# Patient Record
Sex: Male | Born: 1955 | Race: White | Hispanic: No | Marital: Married | Smoking: Never smoker
Health system: Southern US, Community
[De-identification: ages and names within clinical notes are randomized; demographics above are authoritative.]

## PROBLEM LIST (undated history)

## (undated) DIAGNOSIS — G9600 Cerebrospinal fluid leak, unspecified: Secondary | ICD-10-CM

## (undated) DIAGNOSIS — C8590 Non-Hodgkin lymphoma, unspecified, unspecified site: Secondary | ICD-10-CM

## (undated) DIAGNOSIS — R519 Headache, unspecified: Secondary | ICD-10-CM

## (undated) DIAGNOSIS — F329 Major depressive disorder, single episode, unspecified: Secondary | ICD-10-CM

## (undated) DIAGNOSIS — I2510 Atherosclerotic heart disease of native coronary artery without angina pectoris: Secondary | ICD-10-CM

## (undated) DIAGNOSIS — C801 Malignant (primary) neoplasm, unspecified: Secondary | ICD-10-CM

## (undated) DIAGNOSIS — Z9289 Personal history of other medical treatment: Secondary | ICD-10-CM

## (undated) DIAGNOSIS — I700 Atherosclerosis of aorta: Secondary | ICD-10-CM

## (undated) DIAGNOSIS — F419 Anxiety disorder, unspecified: Secondary | ICD-10-CM

## (undated) DIAGNOSIS — F32A Depression, unspecified: Secondary | ICD-10-CM

## (undated) DIAGNOSIS — R030 Elevated blood-pressure reading, without diagnosis of hypertension: Secondary | ICD-10-CM

## (undated) HISTORY — DX: Non-Hodgkin lymphoma, unspecified, unspecified site: C85.90

## (undated) HISTORY — PX: BACK SURGERY: SHX140

## (undated) HISTORY — DX: Elevated blood-pressure reading, without diagnosis of hypertension: R03.0

## (undated) HISTORY — DX: Atherosclerosis of aorta: I70.0

---
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
## 2020-04-23 ENCOUNTER — Emergency Department (HOSPITAL_BASED_OUTPATIENT_CLINIC_OR_DEPARTMENT_OTHER): Payer: Managed Care, Other (non HMO)

## 2020-04-23 ENCOUNTER — Encounter (HOSPITAL_BASED_OUTPATIENT_CLINIC_OR_DEPARTMENT_OTHER): Payer: Self-pay | Admitting: Emergency Medicine

## 2020-04-23 ENCOUNTER — Other Ambulatory Visit: Payer: Self-pay

## 2020-04-23 ENCOUNTER — Inpatient Hospital Stay (HOSPITAL_BASED_OUTPATIENT_CLINIC_OR_DEPARTMENT_OTHER)
Admission: EM | Admit: 2020-04-23 | Discharge: 2020-04-26 | DRG: 247 | Disposition: A | Discharge status: Home / Self Care | Payer: Managed Care, Other (non HMO) | Attending: Cardiology | Admitting: Cardiology

## 2020-04-23 DIAGNOSIS — Z8249 Family history of ischemic heart disease and other diseases of the circulatory system: Secondary | ICD-10-CM

## 2020-04-23 DIAGNOSIS — R519 Headache, unspecified: Secondary | ICD-10-CM | POA: Diagnosis present

## 2020-04-23 DIAGNOSIS — Z20822 Contact with and (suspected) exposure to covid-19: Secondary | ICD-10-CM | POA: Diagnosis present

## 2020-04-23 DIAGNOSIS — I25110 Atherosclerotic heart disease of native coronary artery with unstable angina pectoris: Secondary | ICD-10-CM | POA: Diagnosis not present

## 2020-04-23 DIAGNOSIS — Z881 Allergy status to other antibiotic agents status: Secondary | ICD-10-CM

## 2020-04-23 DIAGNOSIS — I25118 Atherosclerotic heart disease of native coronary artery with other forms of angina pectoris: Secondary | ICD-10-CM

## 2020-04-23 DIAGNOSIS — G9600 Cerebrospinal fluid leak, unspecified: Secondary | ICD-10-CM | POA: Diagnosis present

## 2020-04-23 DIAGNOSIS — G8929 Other chronic pain: Secondary | ICD-10-CM

## 2020-04-23 DIAGNOSIS — Z8051 Family history of malignant neoplasm of kidney: Secondary | ICD-10-CM

## 2020-04-23 DIAGNOSIS — K219 Gastro-esophageal reflux disease without esophagitis: Secondary | ICD-10-CM

## 2020-04-23 DIAGNOSIS — Z833 Family history of diabetes mellitus: Secondary | ICD-10-CM

## 2020-04-23 DIAGNOSIS — Z888 Allergy status to other drugs, medicaments and biological substances status: Secondary | ICD-10-CM

## 2020-04-23 DIAGNOSIS — E785 Hyperlipidemia, unspecified: Secondary | ICD-10-CM

## 2020-04-23 DIAGNOSIS — I10 Essential (primary) hypertension: Secondary | ICD-10-CM

## 2020-04-23 DIAGNOSIS — R079 Chest pain, unspecified: Secondary | ICD-10-CM

## 2020-04-23 DIAGNOSIS — Z955 Presence of coronary angioplasty implant and graft: Secondary | ICD-10-CM

## 2020-04-23 DIAGNOSIS — R5383 Other fatigue: Secondary | ICD-10-CM

## 2020-04-23 DIAGNOSIS — R0789 Other chest pain: Secondary | ICD-10-CM | POA: Diagnosis present

## 2020-04-23 DIAGNOSIS — C8590 Non-Hodgkin lymphoma, unspecified, unspecified site: Secondary | ICD-10-CM | POA: Diagnosis present

## 2020-04-23 DIAGNOSIS — Z803 Family history of malignant neoplasm of breast: Secondary | ICD-10-CM

## 2020-04-23 HISTORY — DX: Depression, unspecified: F32.A

## 2020-04-23 HISTORY — DX: Cerebrospinal fluid leak, unspecified: G96.00

## 2020-04-23 HISTORY — DX: Headache, unspecified: R51.9

## 2020-04-23 HISTORY — DX: Anxiety disorder, unspecified: F41.9

## 2020-04-23 HISTORY — DX: Personal history of other medical treatment: Z92.89

## 2020-04-23 HISTORY — DX: Malignant (primary) neoplasm, unspecified: C80.1

## 2020-04-23 HISTORY — DX: Atherosclerotic heart disease of native coronary artery without angina pectoris: I25.10

## 2020-04-23 LAB — CBC
HCT: 44.9 % (ref 39.0–52.0)
Hemoglobin: 15 g/dL (ref 13.0–17.0)
MCH: 31.2 pg (ref 26.0–34.0)
MCHC: 33.4 g/dL (ref 30.0–36.0)
MCV: 93.3 fL (ref 80.0–100.0)
Platelets: 165 10*3/uL (ref 150–400)
RBC: 4.81 MIL/uL (ref 4.22–5.81)
RDW: 12.3 % (ref 11.5–15.5)
WBC: 5.8 10*3/uL (ref 4.0–10.5)
nRBC: 0 % (ref 0.0–0.2)

## 2020-04-23 LAB — CREATININE, SERUM
Creatinine, Ser: 0.98 mg/dL (ref 0.61–1.24)
GFR calc Af Amer: >60 mL/min (ref >60)
GFR calc non Af Amer: >60 mL/min (ref >60)

## 2020-04-23 LAB — BASIC METABOLIC PANEL
Anion gap: 9 (ref 5–15)
BUN: 22 mg/dL (ref 8–23)
CO2: 26 mmol/L (ref 22–32)
Calcium: 9.2 mg/dL (ref 8.9–10.3)
Chloride: 105 mmol/L (ref 98–111)
Creatinine, Ser: 0.91 mg/dL (ref 0.61–1.24)
GFR calc Af Amer: >60 mL/min (ref >60)
GFR calc non Af Amer: >60 mL/min (ref >60)
Glucose, Bld: 99 mg/dL (ref 70–99)
Potassium: 3.9 mmol/L (ref 3.5–5.1)
Sodium: 140 mmol/L (ref 135–145)

## 2020-04-23 LAB — CBC WITH DIFFERENTIAL/PLATELET
Abs Immature Granulocytes: 0.02 10*3/uL (ref 0.00–0.07)
Basophils Absolute: 0 10*3/uL (ref 0.0–0.1)
Basophils Relative: 1 %
Eosinophils Absolute: 0 10*3/uL (ref 0.0–0.5)
Eosinophils Relative: 1 %
HCT: 44.2 % (ref 39.0–52.0)
Hemoglobin: 14.9 g/dL (ref 13.0–17.0)
Immature Granulocytes: 0 %
Lymphocytes Relative: 20 %
Lymphs Abs: 1.3 10*3/uL (ref 0.7–4.0)
MCH: 31.1 pg (ref 26.0–34.0)
MCHC: 33.7 g/dL (ref 30.0–36.0)
MCV: 92.3 fL (ref 80.0–100.0)
Monocytes Absolute: 0.5 10*3/uL (ref 0.1–1.0)
Monocytes Relative: 7 %
Neutro Abs: 4.4 10*3/uL (ref 1.7–7.7)
Neutrophils Relative %: 71 %
Platelets: 157 10*3/uL (ref 150–400)
RBC: 4.79 MIL/uL (ref 4.22–5.81)
RDW: 12.2 % (ref 11.5–15.5)
WBC: 6.2 10*3/uL (ref 4.0–10.5)
nRBC: 0 % (ref 0.0–0.2)

## 2020-04-23 LAB — HIV ANTIBODY (ROUTINE TESTING W REFLEX): HIV Screen 4th Generation wRfx: NONREACTIVE

## 2020-04-23 LAB — TROPONIN I (HIGH SENSITIVITY)
Troponin I (High Sensitivity): 2 ng/L (ref <18)
Troponin I (High Sensitivity): 3 ng/L (ref <18)
Troponin I (High Sensitivity): 4 ng/L (ref <18)

## 2020-04-23 LAB — SARS CORONAVIRUS 2 BY RT PCR (HOSPITAL ORDER, PERFORMED IN ~~LOC~~ HOSPITAL LAB): SARS Coronavirus 2: NEGATIVE

## 2020-04-23 MED ORDER — NITROGLYCERIN 0.4 MG SL SUBL
0.4000 mg | SUBLINGUAL_TABLET | SUBLINGUAL | Status: DC | PRN
  Administered 2020-04-24: 0.8 mg via SUBLINGUAL

## 2020-04-23 MED ORDER — ONDANSETRON HCL 4 MG/2ML IJ SOLN: 4.0000 mg | Freq: Four times a day (QID) | INTRAMUSCULAR | Status: DC | PRN

## 2020-04-23 MED ORDER — GABAPENTIN 300 MG PO CAPS
300.0000 mg | ORAL_CAPSULE | Freq: Three times a day (TID) | ORAL | Status: DC
  Administered 2020-04-23: 300 mg via ORAL
  Filled 2020-04-23: qty 1

## 2020-04-23 MED ORDER — NITROGLYCERIN 0.4 MG SL SUBL
0.4000 mg | SUBLINGUAL_TABLET | Freq: Once | SUBLINGUAL | Status: AC
  Administered 2020-04-23: 0.4 mg via SUBLINGUAL
  Filled 2020-04-23: qty 1

## 2020-04-23 MED ORDER — HEPARIN SODIUM (PORCINE) 5000 UNIT/ML IJ SOLN
5000.0000 [IU] | Freq: Three times a day (TID) | INTRAMUSCULAR | Status: DC
  Administered 2020-04-23 – 2020-04-24 (×4): 5000 [IU] via SUBCUTANEOUS
  Filled 2020-04-23 (×3): qty 1

## 2020-04-23 MED ORDER — ALUM & MAG HYDROXIDE-SIMETH 200-200-20 MG/5ML PO SUSP
30.0000 mL | Freq: Once | ORAL | Status: AC
  Administered 2020-04-23: 30 mL via ORAL
  Filled 2020-04-23: qty 30

## 2020-04-23 MED ORDER — METOPROLOL TARTRATE 50 MG PO TABS
50.0000 mg | ORAL_TABLET | Freq: Once | ORAL | Status: AC
  Administered 2020-04-24: 50 mg via ORAL
  Filled 2020-04-23: qty 1

## 2020-04-23 MED ORDER — ASPIRIN EC 81 MG PO TBEC
81.0000 mg | DELAYED_RELEASE_TABLET | Freq: Every day | ORAL | Status: DC
  Administered 2020-04-24: 81 mg via ORAL
  Filled 2020-04-23: qty 1

## 2020-04-23 MED ORDER — FAMOTIDINE 20 MG PO TABS
20.0000 mg | ORAL_TABLET | Freq: Once | ORAL | Status: AC
  Administered 2020-04-23: 20 mg via ORAL
  Filled 2020-04-23: qty 1

## 2020-04-23 MED ORDER — ACETAMINOPHEN 325 MG PO TABS: 650.0000 mg | ORAL_TABLET | ORAL | Status: DC | PRN

## 2020-04-23 NOTE — ED Triage Notes (Signed)
Pt states he woke up not feeling well. States he felt fatigued and anxious so he checked his BP and found it to be high. Also reports tightness in his chest and L arm discomfort.

## 2020-04-23 NOTE — H&P (Signed)
Cardiology Admission History and Physical:   Patient ID: [facility] MRN: [id number]; DOB: 1956-04-03   Admission date: 04/23/2020  Primary Care Provider: Osie Cheeks, [facility] Cardiologist:  New to [facility] Electrophysiologist:  None   Chief Complaint:  Chest pain  Patient Profile:   Ronald [facility] is a 64 y.o. male with past medical history of GERD, marginal zone lymphoma, chronic headache, and history of CSF leak followed by [facility] presented with chest pain for 2 days, he was transferred from [facility]  History of Present Illness:   [name] is a 64 year old male with past medical history of GERD, marginal zone lymphoma, chronic headache, and history of CSF leak followed by [facility].  He was diagnosed with marginal zone lymphoma in 2020 after noted noticing a right cervical lymph node.  Lymph node was biopsied and diagnosed by [facility] with marginal zone lymphoma.  He denies any prior cardiac history although he does have significant family history of CAD on his mother side.  5 years ago, he underwent treadmill nuclear stress test in [place] which reportedly was negative.  According to his family 64 note, his blood pressure is typically elevated in the medical office however in the 120s range at home.  Therefore he was never started on blood pressure medication.  Patient recently received his second [place] COVID-19 vaccine roughly 2 weeks ago.  Patient presented to [facility] on 04/23/2020 with substernal chest pain and left shoulder pain that started 2 nights ago during sexual intercourse.  Talking to the patient, since the onset of the chest pain, it has never completely went away.  Troponin obtained today at [facility] was negative.  CBC and basic metabolic panel normal.  Chest x-ray negative.  EKG showed normal sinus rhythm without significant ST-T wave changes.  He denies any exacerbating factors such as deep  inspiration, body rotation or palpation.  He denies any other associated symptoms such as shortness of breath, dizziness or sweating.  Patient eventually was transferred from [facility] to [facility] for further evaluation.  Although his blood pressure while at [facility] was elevated in the 160s range, by the time he reached [facility], systolic blood pressure was in the 130s.    Past Medical History:  Diagnosis Date  . Anxiety   . Cancer ([facility])   . CSF leak   . Depression   . Headache     Past Surgical History:  Procedure Laterality Date  . BACK SURGERY       Medications Prior to Admission: Prior to Admission medications   Medication Sig Start Date End Date Taking? Authorizing Provider  gabapentin (NEURONTIN) 300 MG capsule Take 300 mg by mouth 3 (three) times daily.   Yes [provider]     Allergies:    Allergies  Allergen Reactions  . Amitriptyline     Made me feel strange  . Keflex [Cephalexin]     Stomach pain  . Topiramate     Made me feel strange    Social History:   Social History   Socioeconomic History  . Marital status: Married    Spouse name: Not on file  . Number of children: Not on file  . Years of education: Not on file  . Highest education level: Not on file  Occupational History  . Not on file  Tobacco Use  . Smoking status: Never Smoker  . Smokeless tobacco: Never Used  Vaping Use  . Vaping Use: Never used  Substance and Sexual Activity  . Alcohol use: Not Currently  . Drug use: Never  . Sexual activity: Yes  Other Topics Concern  . Not on file  Social History Narrative  . Not on file   Social Determinants of Health   Financial Resource Strain:   . Difficulty of Paying Living Expenses:   Food Insecurity:   . Worried About Charity fundraiser in the Last Year:   . Arboriculturist in the Last Year:   Transportation Needs:   . Film/video editor (Medical):   Marland Kitchen Lack of Transportation  (Non-Medical):   Physical Activity:   . Days of Exercise per Week:   . Minutes of Exercise per Session:   Stress:   . Feeling of Stress :   Social Connections:   . Frequency of Communication with Friends and Family:   . Frequency of Social Gatherings with Friends and Family:   . Attends Religious Services:   . Active Member of Clubs or Organizations:   . Attends Archivist Meetings:   Marland Kitchen Marital Status:   Intimate Partner Violence:   . Fear of Current or Ex-Partner:   . Emotionally Abused:   Marland Kitchen Physically Abused:   . Sexually Abused:     Family History:   The patient's family history includes Breast cancer in his sister; Diabetes Mellitus II in his father; Heart attack in his father and maternal grandfather; Hypertension in his father; Kidney cancer in his brother; Parkinson's disease in an other family member.    ROS:  Please see the history of present illness.  All other ROS reviewed and negative.     Physical Exam/Data:   Vitals:   04/23/20 1001 04/23/20 1224 04/23/20 1252 04/23/20 1348  BP: (!) 159/87  (!) 159/82 (!) 132/97  Pulse: (!) 59 77  63  Resp: 12 18  14   Temp:    98.6 F (37 C)  TempSrc:    Oral  SpO2: 96% 95% 95% 97%  Weight:    88.7 kg  Height:    5\' 9"  (1.753 m)   No intake or output data in the 24 hours ending 04/23/20 1454 Last 3 Weights 04/23/2020 04/23/2020  Weight (lbs) 195 lb 8 oz 190 lb  Weight (kg) 88.678 kg 86.183 kg     Body mass index is 28.87 kg/m.  General:  Well nourished, well developed, in no acute distress HEENT: normal Lymph: no adenopathy Neck: no JVD Endocrine:  No thryomegaly Vascular: No carotid bruits; FA pulses 2+ bilaterally without bruits  Cardiac:  normal S1, S2; RRR; no murmur  Lungs:  clear to auscultation bilaterally, no wheezing, rhonchi or rales  Abd: soft, nontender, no hepatomegaly  Ext: no edema Musculoskeletal:  No deformities, BUE and BLE strength normal and equal Skin: warm and dry  Neuro:  CNs 2-12  intact, no focal abnormalities noted Psych:  Normal affect    EKG:  The ECG that was done and was personally reviewed and demonstrates NSR without significant ST-T wave changes  Relevant CV Studies:  N/A  Laboratory Data:  High Sensitivity Troponin:   Recent Labs  Lab 04/23/20 0810  TROPONINIHS 2      Chemistry Recent Labs  Lab 04/23/20 0810  NA 140  K 3.9  CL 105  CO2 26  GLUCOSE 99  BUN 22  CREATININE 0.91  CALCIUM 9.2  GFRNONAA >60  GFRAA >60  ANIONGAP 9  No results for input(s): PROT, ALBUMIN, AST, ALT, ALKPHOS, BILITOT in the last 168 hours. Hematology Recent Labs  Lab 04/23/20 0810  WBC 6.2  RBC 4.79  HGB 14.9  HCT 44.2  MCV 92.3  MCH 31.1  MCHC 33.7  RDW 12.2  PLT 157   BNPNo results for input(s): BNP, PROBNP in the last 168 hours.  DDimer No results for input(s): DDIMER in the last 168 hours.   Radiology/Studies:  DG Chest 2 View  Result Date: 04/23/2020 CLINICAL DATA:  Chest pain.  Elevated blood pressure. EXAM: CHEST - 2 VIEW COMPARISON:  03/01/2016 FINDINGS: Mild right hemidiaphragm elevation. Numerous leads and wires project over the chest. Midline trachea. Normal heart size and mediastinal contours. No pleural effusion or pneumothorax. Clear lungs. IMPRESSION: No acute cardiopulmonary disease. Electronically Signed   By: Abigail Miyamoto M.D.   On: 04/23/2020 09:07       HEAR Score (for undifferentiated chest pain):  HEAR Score: 3       Assessment and Plan:   1. Chest pain: Per patient, he had a negative treadmill nuclear stress test 5 years ago while traveling to another state.   - Although patient says his chest pain is related to exertion occurred during sexual intercourse, he also mentions chest pain started 2 days ago and has never completely went away.  EKG is normal, troponin negative.    - Will discuss with MD to consider coronary CT  2. Marginal zone lymphoma: Stable on recent CT.  Followed by [facility] hematology  service.  3. History of CSF leak: Followed by [facility]  4. Chronic headache: controlled   Severity of Illness: The appropriate patient status for this patient is OBSERVATION. Observation status is judged to be reasonable and necessary in order to provide the required intensity of service to ensure the patient's safety. The patient's presenting symptoms, physical exam findings, and initial radiographic and laboratory data in the context of their medical condition is felt to place them at decreased risk for further clinical deterioration. Furthermore, it is anticipated that the patient will be medically stable for discharge from the [facility] within 2 midnights of admission. The following factors support the patient status of observation.   " The patient's presenting symptoms include chest pain. " The physical exam findings include benign. " The initial radiographic and laboratory data are normal.     For questions or updates, please contact [place] Please consult [email or web address] for contact info under     Hilbert Corrigan, [place]  04/23/2020 2:54 PM

## 2020-04-23 NOTE — ED Notes (Signed)
Called [facility] line [phone] spoke to Dola Argyle will have Cardiology call Dr.Trifan

## 2020-04-23 NOTE — ED Notes (Signed)
Patient transported to X-ray 

## 2020-04-23 NOTE — ED Provider Notes (Signed)
[facility] Note   CSN: [id number] Arrival date & time: 04/23/20  0741     History Chief Complaint  Patient presents with  . Fatigue    [facility] is a 64 y.o. male w/ hx of reflux, marginal zone lymphoma (not on chemo or radiation, follows at [facility]), chronic headaches, presenting to the ED with chest discomfort and left shoulder pain.  The patient reports onset of symptoms last night during sexual intercourse.  He describes pressure in his left chest and substernum, that feels similar to his "GERD," but is now associated with left shoulder pain, which is new.  The symptoms have been persistent since last night into this morning.  He also describes feeling very "fatigued" since yesterday.  He has chronic headaches and has one now.  He denies nausea, vomiting, or diaphoresis.  He reports this morning his blood pressure at home was also quite elevated.  Normally he is around 924 mmhg systolic, today was 268/341 mmhg.  He does not take anti-hypertensive medications  He states he had similar symptoms 5 years ago and was admitted to the hospital and had a normal stress test, and was discharged home with a presumptive diagnosis of GERD.  Patient received both Moderna covid vaccines most recently 2 weeks ago (2nd dose).  He does not smoke. He normally does not have hypertension.  Per care everywhere records, he had LDL level 140's during wellness check in June 2021.  Mother's family has CV disease, father had MI and diabetes.  He was offered a statin but declined at that time, in favor of lifestyle changes.  He does not have a cardiologist.  HPI  HPI: A 64 year old patient presents for evaluation of chest pain. Initial onset of pain was more than 6 hours ago. The patient's chest pain is described as heaviness/pressure/tightness and is not worse with exertion. The patient's chest pain is middle- or left-sided, is not well-localized, is not sharp and  does not radiate to the arms/jaw/neck. The patient does not complain of nausea and denies diaphoresis. The patient has a family history of coronary artery disease in a first-degree relative with onset less than age 35. The patient has no history of stroke, has no history of peripheral artery disease, has not smoked in the past 90 days, denies any history of treated diabetes, is not hypertensive, has no history of hypercholesterolemia and does not have an elevated BMI (>=30).   Past Medical History:  Diagnosis Date  . Anxiety   . Cancer ([facility])   . CSF leak   . Depression   . Headache     Patient Active Problem List   Diagnosis Date Noted  . Chest pain 04/23/2020    Past Surgical History:  Procedure Laterality Date  . BACK SURGERY         Family History  Problem Relation Age of Onset  . Heart attack Father        died in his sleep, unclear if heart attack or stroke  . Hypertension Father   . Diabetes Mellitus II Father   . Breast cancer Sister   . Kidney cancer Brother   . Parkinson's disease Other   . Heart attack Maternal Grandfather        occurs in his 73s    Social History   Tobacco Use  . Smoking status: Never Smoker  . Smokeless tobacco: Never Used  Vaping Use  . Vaping Use: Never used  Substance Use Topics  .  Alcohol use: Not Currently  . Drug use: Never    Home Medications Prior to Admission medications   Medication Sig Start Date End Date Taking? Authorizing [facility]  ascorbic acid (VITAMIN C) 500 MG tablet Take 500 mg by mouth daily.   Yes [facility], Historical, MD  Cholecalciferol (VITAMIN D3 PO) Take 1 tablet by mouth daily. 75000IU   Yes [facility], Historical, MD  Digestive Enzymes (DIGESTIVE ENZYME PO) Take 1 tablet by mouth in the morning and at bedtime.   Yes Tapia, Historical, MD  gabapentin (NEURONTIN) 300 MG capsule Take 300 mg by mouth 3 (three) times daily.   Yes Tapia, Historical, MD  MAGNESIUM GLYCINATE PLUS PO Take 1 tablet by mouth  daily. 400mg    Yes [facility], Historical, MD  Menaquinone-7 (VITAMIN K2 PO) Take 1 tablet by mouth daily. 150mg    Yes [facility], Historical, MD  OVER THE COUNTER MEDICATION Take 1 tablet by mouth in the morning, at noon, and at bedtime. Medication:Beet Root   Yes [facility], Historical, MD    Allergies    Amitriptyline, Keflex [cephalexin], and Topiramate  Review of Systems   Review of Systems  Constitutional: Positive for fatigue. Negative for chills and fever.  HENT: Negative for ear pain and sore throat.   Eyes: Negative for pain and visual disturbance.  Respiratory: Negative for cough and shortness of breath.   Cardiovascular: Positive for chest pain. Negative for palpitations.  Gastrointestinal: Negative for abdominal pain, nausea and vomiting.  Genitourinary: Negative for dysuria and hematuria.  Musculoskeletal: Positive for arthralgias and myalgias.  Skin: Negative for color change and rash.  Neurological: Positive for headaches. Negative for syncope.  All other systems reviewed and are negative.   Physical Exam Updated Vital Signs BP (!) 132/97 (BP Location: Right Arm)   Pulse 63   Temp 98.6 F (37 C) (Oral)   Resp 14   Ht 5\' 9"  (1.753 m)   Wt 88.7 kg   SpO2 97%   BMI 28.87 kg/m   Physical Exam Vitals and nursing note reviewed.  Constitutional:      Appearance: He is well-developed.  HENT:     Head: Normocephalic and atraumatic.  Eyes:     Conjunctiva/sclera: Conjunctivae normal.  Cardiovascular:     Rate and Rhythm: Normal rate and regular rhythm.     Pulses: Normal pulses.     Heart sounds: No murmur heard.   Pulmonary:     Effort: Pulmonary effort is normal. No respiratory distress.     Breath sounds: Normal breath sounds.  Abdominal:     General: There is no distension.     Palpations: Abdomen is soft.     Tenderness: There is no abdominal tenderness.  Musculoskeletal:        General: No swelling or tenderness.     Cervical back: Neck supple.   Skin:    General: Skin is warm and dry.  Neurological:     General: No focal deficit present.     Mental Status: He is alert and oriented to person, place, and time.  Psychiatric:        Mood and Affect: Mood normal.        Behavior: Behavior normal.     ED Results / Procedures / Treatments   Labs (all labs ordered are listed, but only abnormal results are displayed) Labs Reviewed  SARS CORONAVIRUS 2 BY RT PCR ([facility])  BASIC METABOLIC PANEL  CBC WITH DIFFERENTIAL/PLATELET  HIV ANTIBODY (ROUTINE  TESTING W REFLEX)  CBC  CREATININE, SERUM  TROPONIN I (HIGH SENSITIVITY)  TROPONIN I (HIGH SENSITIVITY)    EKG EKG Interpretation  Date/Time:  Saturday April 23 2020 07:53:42 EDT Ventricular Rate:  71 PR Interval:    QRS Duration: 89 QT Interval:  373 QTC Calculation: 406 R Axis:   63 Text Interpretation: Sinus rhythm No STEMI Confirmed by Octaviano Glow [phone]) on 04/23/2020 8:16:34 AM   Radiology DG Chest 2 View  Result Date: 04/23/2020 CLINICAL DATA:  Chest pain.  Elevated blood pressure. EXAM: CHEST - 2 VIEW COMPARISON:  03/01/2016 FINDINGS: Mild right hemidiaphragm elevation. Numerous leads and wires project over the chest. Midline trachea. Normal heart size and mediastinal contours. No pleural effusion or pneumothorax. Clear lungs. IMPRESSION: No acute cardiopulmonary disease. Electronically Signed   By: Abigail Miyamoto M.D.   On: 04/23/2020 09:07    Procedures Procedures (including critical care time)  Medications Ordered in ED Medications  aspirin EC tablet 81 mg (has no administration in time range)  nitroGLYCERIN (NITROSTAT) SL tablet 0.4 mg (has no administration in time range)  acetaminophen (TYLENOL) tablet 650 mg (has no administration in time range)  ondansetron (ZOFRAN) injection 4 mg (has no administration in time range)  heparin injection 5,000 Units (has no administration in time range)  metoprolol tartrate  (LOPRESSOR) tablet 50 mg (has no administration in time range)  nitroGLYCERIN (NITROSTAT) SL tablet 0.4 mg (0.4 mg Sublingual Given 04/23/20 0811)  alum & mag hydroxide-simeth (MAALOX/MYLANTA) 200-200-20 MG/5ML suspension 30 mL (30 mLs Oral Given 04/23/20 0903)  famotidine (PEPCID) tablet 20 mg (20 mg Oral Given 04/23/20 6314)    ED Course  I have reviewed the triage vital signs and the nursing notes.  Pertinent labs & imaging results that were available during my care of the patient were reviewed by me and considered in my medical decision making (see chart for details).  64 yo male presenting to ED with chest pain and left shoulder pain since last night, which began during intercourse.  DDx includes ACS vs Reflux/GERD vs gastritis vs PNA vs PTX vs Cervical angina vs muscle strain vs other  He seems to think the chest pressure is very similar to his reflux, which he has had often in the past.  He is most concerned about his left shoulder pain and fatigue, which are new symptoms, and I agree are more concerning for coronary etiology.  ECG on arrival per my interpretation shows NSR with no ischemic changes.  HR 71, QTc 406.  Telemetry reviewed with NSR.  Labs ordered including trop, BMP, CBC with diff.  I personally reviewed this labs which showed normal CBC, BMP, Trop (2) and COVID negative.  DG chest obtained and per my interpretation shows no acute pulmonary disease or consolidation  We'll try 1 SL nitro, see if this improves his symptoms.  If not, we'll try a GI cocktail as well.  He did receive both doses of Moderna for Covid confirmed by record review within the past month.  *  I personally reviewed prior medical records including his office visit for wellness check in June 2021  Clinical Course as of Apr 23 1536  Sat Apr 23, 2020  9702 No change in symptoms after SL nitro.  Labs returned with hs trop of 2, with symptom onset > 12 hours ago, I don't think we need to repeat this  level.  We'll try GI cocktail to see if this may be reflux related.   [MT]  [phone]  IMPRESSION: No acute cardiopulmonary disease.    [MT]  1033 I consulted with Dr Radford Pax from cardiology who agreed with an obs admission for CT coronary angiogram.  Patient and wife informed and agreeable to staying.  They were made aware of a critical bed shortage at [facility] with an estimated wait time 10-24 hours, although we will make every effort to expedite the process.  He remains stable on telemetry monitor with no change in symptoms.   [MT]    Clinical Course User Index [MT] Columbia Pandey, Carola Rhine, MD    Final Clinical Impression(s) / ED Diagnoses Final diagnoses:  Chest pain, unspecified type  Fatigue, unspecified type    Rx / DC Orders ED Discharge Orders    None       Wyvonnia Dusky, MD 04/23/20 1537

## 2020-04-24 ENCOUNTER — Other Ambulatory Visit: Payer: Self-pay | Admitting: Cardiology

## 2020-04-24 ENCOUNTER — Observation Stay (HOSPITAL_COMMUNITY): Payer: Managed Care, Other (non HMO)

## 2020-04-24 ENCOUNTER — Observation Stay (HOSPITAL_BASED_OUTPATIENT_CLINIC_OR_DEPARTMENT_OTHER): Payer: Managed Care, Other (non HMO)

## 2020-04-24 ENCOUNTER — Encounter (HOSPITAL_COMMUNITY): Payer: Self-pay | Admitting: Cardiology

## 2020-04-24 DIAGNOSIS — K219 Gastro-esophageal reflux disease without esophagitis: Secondary | ICD-10-CM

## 2020-04-24 DIAGNOSIS — E7800 Pure hypercholesterolemia, unspecified: Secondary | ICD-10-CM

## 2020-04-24 DIAGNOSIS — R079 Chest pain, unspecified: Secondary | ICD-10-CM | POA: Diagnosis not present

## 2020-04-24 DIAGNOSIS — E785 Hyperlipidemia, unspecified: Secondary | ICD-10-CM

## 2020-04-24 DIAGNOSIS — I2510 Atherosclerotic heart disease of native coronary artery without angina pectoris: Secondary | ICD-10-CM | POA: Diagnosis not present

## 2020-04-24 HISTORY — DX: Gastro-esophageal reflux disease without esophagitis: K21.9

## 2020-04-24 HISTORY — DX: Hyperlipidemia, unspecified: E78.5

## 2020-04-24 LAB — BASIC METABOLIC PANEL
Anion gap: 8 (ref 5–15)
BUN: 15 mg/dL (ref 8–23)
CO2: 26 mmol/L (ref 22–32)
Calcium: 9.5 mg/dL (ref 8.9–10.3)
Chloride: 102 mmol/L (ref 98–111)
Creatinine, Ser: 1.01 mg/dL (ref 0.61–1.24)
GFR calc Af Amer: >60 mL/min (ref >60)
GFR calc non Af Amer: >60 mL/min (ref >60)
Glucose, Bld: 101 mg/dL — ABNORMAL HIGH (ref 70–99)
Potassium: 4 mmol/L (ref 3.5–5.1)
Sodium: 136 mmol/L (ref 135–145)

## 2020-04-24 LAB — LIPID PANEL
Cholesterol: 242 mg/dL — ABNORMAL HIGH (ref 0–200)
HDL: 41 mg/dL (ref >40)
LDL Cholesterol: 178 mg/dL — ABNORMAL HIGH (ref 0–99)
Total CHOL/HDL Ratio: 5.9 RATIO
Triglycerides: 116 mg/dL (ref <150)
VLDL: 23 mg/dL (ref 0–40)

## 2020-04-24 LAB — ECHOCARDIOGRAM COMPLETE
Area-P 1/2: 4.49 cm2
Height: 69 in
S' Lateral: 2.7 cm
Weight: 3068.8 oz

## 2020-04-24 MED ORDER — GABAPENTIN 300 MG PO CAPS
300.0000 mg | ORAL_CAPSULE | Freq: Three times a day (TID) | ORAL | Status: DC
  Administered 2020-04-24 – 2020-04-26 (×7): 300 mg via ORAL
  Filled 2020-04-24 (×7): qty 1

## 2020-04-24 MED ORDER — NITROGLYCERIN 0.4 MG SL SUBL
SUBLINGUAL_TABLET | SUBLINGUAL | Status: AC
  Filled 2020-04-24: qty 2

## 2020-04-24 MED ORDER — SODIUM CHLORIDE 0.9% FLUSH
3.0000 mL | Freq: Two times a day (BID) | INTRAVENOUS | Status: DC
  Administered 2020-04-25 – 2020-04-26 (×3): 3 mL via INTRAVENOUS

## 2020-04-24 MED ORDER — IOHEXOL 350 MG/ML SOLN
80.0000 mL | Freq: Once | INTRAVENOUS | Status: AC | PRN
  Administered 2020-04-24: 80 mL via INTRAVENOUS

## 2020-04-24 NOTE — Progress Notes (Signed)
Progress Note  Patient Name: [facility] Date of Encounter: 04/24/2020  [facility] Cardiologist: Fransico Him, MD    Subjective   He is still having some chest pressure this AM.  He took metoprolol without eating this AM and is s/w nauseated.     Inpatient Medications    Scheduled Meds:  aspirin EC  81 mg Oral Daily   gabapentin  300 mg Oral TID   heparin  5,000 Units Subcutaneous Q8H   Continuous Infusions:  PRN Meds: acetaminophen, nitroGLYCERIN, ondansetron (ZOFRAN) IV   Vital Signs    Vitals:   04/23/20 1707 04/23/20 2022 04/24/20 0014 04/24/20 0603  BP: 134/82 113/81 117/82 131/83  Pulse: 60 72 60 66  Resp: 16 15 17 18   Temp: 98 F (36.7 C) 98.5 F (36.9 C) 97.6 F (36.4 C) 97.6 F (36.4 C)  TempSrc: Oral Oral Other (Comment) Oral  SpO2: 99% 96% 98% 97%  Weight:    87 kg  Height:        Intake/Output Summary (Last 24 hours) at 04/24/2020 0926 Last data filed at 04/23/2020 2000 Gross per 24 hour  Intake 360 ml  Output --  Net 360 ml   Last 3 Weights 04/24/2020 04/23/2020 04/23/2020  Weight (lbs) 191 lb 12.8 oz 195 lb 8 oz 190 lb  Weight (kg) 87 kg 88.678 kg 86.183 kg      Telemetry    Normal sinus rhythm  - Personally Reviewed    Physical Exam   GEN: No acute distress.   Neck: supple  Cardiac: RRR, no murmurs, rubs, or gallops.  Respiratory: Clear to auscultation bilaterally. GI: Soft, nontender  MS: No edema; No deformity. Neuro:  Nonfocal  Psych: Normal affect   Labs    High Sensitivity Troponin:   Recent Labs  Lab 04/23/20 0810 04/23/20 1625 04/23/20 1717  TROPONINIHS 2 3 4       Chemistry Recent Labs  Lab 04/23/20 0810 04/23/20 1625 04/24/20 0532  NA 140  --  136  K 3.9  --  4.0  CL 105  --  102  CO2 26  --  26  GLUCOSE 99  --  101*  BUN 22  --  15  CREATININE 0.91 0.98 1.01  CALCIUM 9.2  --  9.5  GFRNONAA >60 >60 >60  GFRAA >60 >60 >60  ANIONGAP 9  --  8     Hematology Recent Labs  Lab  04/23/20 0810 04/23/20 1625  WBC 6.2 5.8  RBC 4.79 4.81  HGB 14.9 15.0  HCT 44.2 44.9  MCV 92.3 93.3  MCH 31.1 31.2  MCHC 33.7 33.4  RDW 12.2 12.3  PLT 157 165   04/24/2020: HDL 41; LDL Cholesterol 178     Radiology    DG Chest 2 View  Result Date: 04/23/2020 CLINICAL DATA:  Chest pain.  Elevated blood pressure. EXAM: CHEST - 2 VIEW COMPARISON:  03/01/2016 FINDINGS: Mild right hemidiaphragm elevation. Numerous leads and wires project over the chest. Midline trachea. Normal heart size and mediastinal contours. No pleural effusion or pneumothorax. Clear lungs. IMPRESSION: No acute cardiopulmonary disease. Electronically Signed   By: Abigail Miyamoto M.D.   On: 04/23/2020 09:07    Cardiac Studies   Echocardiogram pending Coronary CTA pending   Patient Profile     64 y.o. male with lymphoma, chronic HA 2/2 CSF leak, GERD, white coat hypertension, FHx of CAD.  He presented with ongoing chest pain > 48 hours in duration.  High sensitivity Troponin  levels have been neg.  Plan is for an echocardiogram and Coronary CTA to further evaluate.     Assessment & Plan    1. Chest pain  He continue to have chest pain.  Hs-Trop levels are all neg.  Coronary CTA and echocardiogram pending.   2. Hyperlipidemia  10-year ASCVD risk score is 14.9%.  It is reasonable to consider mod to high intensity statin Rx.  Will await CT results before initiating Rx. The 10-year ASCVD risk score Mikey Bussing DC Brooke Bonito., et al., 2013) is: 14.9%   Values used to calculate the score:     Age: 64 years     Sex: Male     Is Non-Hispanic African American: No     Diabetic: No     Tobacco smoker: No     Systolic Blood Pressure: 852 mmHg     Is BP treated: No     HDL Cholesterol: 41 mg/dL     Total Cholesterol: 242 mg/dL   3. 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  I will adjust schedule.   For questions or updates, please contact [place] Please consult  [email or web address] for contact info under        Signed, Richardson Dopp, PA-C  04/24/2020, 9:26 AM

## 2020-04-24 NOTE — Progress Notes (Signed)
  Echocardiogram 2D Echocardiogram has been performed.  [facility] 04/24/2020, 8:47 AM

## 2020-04-24 NOTE — Progress Notes (Signed)
°  Echocardiogram 2D Echocardiogram has been performed.  [facility] 04/24/2020, 8:49 AM

## 2020-04-25 ENCOUNTER — Encounter (HOSPITAL_COMMUNITY)
Admission: EM | Disposition: A | Discharge status: Home / Self Care | Payer: Self-pay | Source: Home / Self Care | Attending: Cardiology

## 2020-04-25 DIAGNOSIS — Z20822 Contact with and (suspected) exposure to covid-19: Secondary | ICD-10-CM | POA: Diagnosis present

## 2020-04-25 DIAGNOSIS — E782 Mixed hyperlipidemia: Secondary | ICD-10-CM

## 2020-04-25 DIAGNOSIS — Z8249 Family history of ischemic heart disease and other diseases of the circulatory system: Secondary | ICD-10-CM | POA: Diagnosis not present

## 2020-04-25 DIAGNOSIS — I200 Unstable angina: Secondary | ICD-10-CM | POA: Diagnosis not present

## 2020-04-25 DIAGNOSIS — R0789 Other chest pain: Secondary | ICD-10-CM | POA: Diagnosis not present

## 2020-04-25 DIAGNOSIS — I2510 Atherosclerotic heart disease of native coronary artery without angina pectoris: Secondary | ICD-10-CM

## 2020-04-25 DIAGNOSIS — I10 Essential (primary) hypertension: Secondary | ICD-10-CM | POA: Diagnosis present

## 2020-04-25 DIAGNOSIS — Z881 Allergy status to other antibiotic agents status: Secondary | ICD-10-CM | POA: Diagnosis not present

## 2020-04-25 DIAGNOSIS — E785 Hyperlipidemia, unspecified: Secondary | ICD-10-CM | POA: Diagnosis present

## 2020-04-25 DIAGNOSIS — K219 Gastro-esophageal reflux disease without esophagitis: Secondary | ICD-10-CM | POA: Diagnosis present

## 2020-04-25 DIAGNOSIS — R519 Headache, unspecified: Secondary | ICD-10-CM | POA: Diagnosis present

## 2020-04-25 DIAGNOSIS — R079 Chest pain, unspecified: Secondary | ICD-10-CM

## 2020-04-25 DIAGNOSIS — I25110 Atherosclerotic heart disease of native coronary artery with unstable angina pectoris: Secondary | ICD-10-CM | POA: Diagnosis present

## 2020-04-25 DIAGNOSIS — C8590 Non-Hodgkin lymphoma, unspecified, unspecified site: Secondary | ICD-10-CM | POA: Diagnosis present

## 2020-04-25 DIAGNOSIS — G9600 Cerebrospinal fluid leak, unspecified: Secondary | ICD-10-CM | POA: Diagnosis present

## 2020-04-25 DIAGNOSIS — Z803 Family history of malignant neoplasm of breast: Secondary | ICD-10-CM | POA: Diagnosis not present

## 2020-04-25 DIAGNOSIS — Z8051 Family history of malignant neoplasm of kidney: Secondary | ICD-10-CM | POA: Diagnosis not present

## 2020-04-25 DIAGNOSIS — I25118 Atherosclerotic heart disease of native coronary artery with other forms of angina pectoris: Secondary | ICD-10-CM | POA: Diagnosis not present

## 2020-04-25 DIAGNOSIS — Z833 Family history of diabetes mellitus: Secondary | ICD-10-CM | POA: Diagnosis not present

## 2020-04-25 DIAGNOSIS — Z888 Allergy status to other drugs, medicaments and biological substances status: Secondary | ICD-10-CM | POA: Diagnosis not present

## 2020-04-25 DIAGNOSIS — G8929 Other chronic pain: Secondary | ICD-10-CM

## 2020-04-25 HISTORY — PX: CORONARY STENT INTERVENTION: CATH118234

## 2020-04-25 HISTORY — PX: LEFT HEART CATH AND CORONARY ANGIOGRAPHY: CATH118249

## 2020-04-25 LAB — MRSA PCR SCREENING: MRSA by PCR: NEGATIVE

## 2020-04-25 LAB — POCT ACTIVATED CLOTTING TIME: Activated Clotting Time: 329 seconds

## 2020-04-25 SURGERY — LEFT HEART CATH AND CORONARY ANGIOGRAPHY
Anesthesia: LOCAL

## 2020-04-25 MED ORDER — SODIUM CHLORIDE 0.9% FLUSH: 3.0000 mL | INTRAVENOUS | Status: DC | PRN

## 2020-04-25 MED ORDER — MORPHINE SULFATE (PF) 2 MG/ML IV SOLN
INTRAVENOUS | Status: AC
  Filled 2020-04-25: qty 1

## 2020-04-25 MED ORDER — HYDRALAZINE HCL 20 MG/ML IJ SOLN
INTRAMUSCULAR | Status: DC | PRN
  Administered 2020-04-25: 10 mg via INTRAVENOUS

## 2020-04-25 MED ORDER — LABETALOL HCL 5 MG/ML IV SOLN: 10.0000 mg | INTRAVENOUS | Status: AC | PRN

## 2020-04-25 MED ORDER — LIDOCAINE HCL (PF) 1 % IJ SOLN
INTRAMUSCULAR | Status: DC | PRN
  Administered 2020-04-25: 1 mL via INTRADERMAL

## 2020-04-25 MED ORDER — CHLORHEXIDINE GLUCONATE CLOTH 2 % EX PADS
6.0000 | MEDICATED_PAD | Freq: Every day | CUTANEOUS | Status: DC
  Administered 2020-04-26: 6 via TOPICAL

## 2020-04-25 MED ORDER — NITROGLYCERIN IN D5W 200-5 MCG/ML-% IV SOLN
INTRAVENOUS | Status: AC
  Filled 2020-04-25: qty 250

## 2020-04-25 MED ORDER — SODIUM CHLORIDE 0.9% FLUSH
3.0000 mL | Freq: Two times a day (BID) | INTRAVENOUS | Status: DC
  Administered 2020-04-26: 3 mL via INTRAVENOUS

## 2020-04-25 MED ORDER — HEPARIN SODIUM (PORCINE) 1000 UNIT/ML IJ SOLN
INTRAMUSCULAR | Status: AC
  Filled 2020-04-25: qty 1

## 2020-04-25 MED ORDER — HEPARIN (PORCINE) 25000 UT/250ML-% IV SOLN
1000.0000 [IU]/h | INTRAVENOUS | Status: DC
  Administered 2020-04-25: 1000 [IU]/h via INTRAVENOUS
  Filled 2020-04-25: qty 250

## 2020-04-25 MED ORDER — ATORVASTATIN CALCIUM 80 MG PO TABS
80.0000 mg | ORAL_TABLET | Freq: Every day | ORAL | Status: DC
  Administered 2020-04-25 – 2020-04-26 (×2): 80 mg via ORAL
  Filled 2020-04-25 (×2): qty 1

## 2020-04-25 MED ORDER — ONDANSETRON HCL 4 MG/2ML IJ SOLN: 4.0000 mg | Freq: Four times a day (QID) | INTRAMUSCULAR | Status: DC | PRN

## 2020-04-25 MED ORDER — NITROGLYCERIN 1 MG/10 ML FOR IR/CATH LAB
INTRA_ARTERIAL | Status: AC
  Filled 2020-04-25: qty 10

## 2020-04-25 MED ORDER — ASPIRIN 81 MG PO CHEW
81.0000 mg | CHEWABLE_TABLET | Freq: Every day | ORAL | Status: DC
  Administered 2020-04-25 – 2020-04-26 (×2): 81 mg via ORAL
  Filled 2020-04-25 (×2): qty 1

## 2020-04-25 MED ORDER — IOHEXOL 350 MG/ML SOLN
INTRAVENOUS | Status: DC | PRN
  Administered 2020-04-25: 225 mL via INTRA_ARTERIAL

## 2020-04-25 MED ORDER — FAMOTIDINE IN NACL 20-0.9 MG/50ML-% IV SOLN
INTRAVENOUS | Status: AC
  Filled 2020-04-25: qty 50

## 2020-04-25 MED ORDER — HEPARIN SODIUM (PORCINE) 1000 UNIT/ML IJ SOLN
INTRAMUSCULAR | Status: DC | PRN
  Administered 2020-04-25: 4500 [IU] via INTRAVENOUS
  Administered 2020-04-25: 6000 [IU] via INTRAVENOUS

## 2020-04-25 MED ORDER — PANTOPRAZOLE SODIUM 40 MG PO TBEC
40.0000 mg | DELAYED_RELEASE_TABLET | Freq: Every day | ORAL | Status: DC
  Administered 2020-04-25 – 2020-04-26 (×2): 40 mg via ORAL
  Filled 2020-04-25 (×2): qty 1

## 2020-04-25 MED ORDER — HEPARIN BOLUS VIA INFUSION
4000.0000 [IU] | Freq: Once | INTRAVENOUS | Status: AC
  Administered 2020-04-25: 4000 [IU] via INTRAVENOUS
  Filled 2020-04-25: qty 4000

## 2020-04-25 MED ORDER — HYDRALAZINE HCL 20 MG/ML IJ SOLN: 10.0000 mg | INTRAMUSCULAR | Status: AC | PRN

## 2020-04-25 MED ORDER — ATORVASTATIN CALCIUM 80 MG PO TABS
80.0000 mg | ORAL_TABLET | Freq: Every day | ORAL | Status: DC
  Administered 2020-04-25: 80 mg via ORAL
  Filled 2020-04-25: qty 1

## 2020-04-25 MED ORDER — LIDOCAINE HCL (PF) 1 % IJ SOLN
INTRAMUSCULAR | Status: AC
  Filled 2020-04-25: qty 30

## 2020-04-25 MED ORDER — CLOPIDOGREL BISULFATE 75 MG PO TABS
75.0000 mg | ORAL_TABLET | Freq: Every day | ORAL | Status: DC
  Administered 2020-04-26: 75 mg via ORAL
  Filled 2020-04-25: qty 1

## 2020-04-25 MED ORDER — CLOPIDOGREL BISULFATE 300 MG PO TABS
ORAL_TABLET | ORAL | Status: DC | PRN
  Administered 2020-04-25: 600 mg via ORAL

## 2020-04-25 MED ORDER — ASPIRIN 81 MG PO CHEW
81.0000 mg | CHEWABLE_TABLET | ORAL | Status: AC
  Administered 2020-04-25: 81 mg via ORAL
  Filled 2020-04-25: qty 1

## 2020-04-25 MED ORDER — ACETAMINOPHEN 325 MG PO TABS: 650.0000 mg | ORAL_TABLET | ORAL | Status: DC | PRN

## 2020-04-25 MED ORDER — HEPARIN (PORCINE) IN NACL 1000-0.9 UT/500ML-% IV SOLN
INTRAVENOUS | Status: DC | PRN
  Administered 2020-04-25 (×2): 500 mL

## 2020-04-25 MED ORDER — SODIUM CHLORIDE 0.9 % IV SOLN: 250.0000 mL | INTRAVENOUS | Status: DC | PRN

## 2020-04-25 MED ORDER — VERAPAMIL HCL 2.5 MG/ML IV SOLN
INTRA_ARTERIAL | Status: DC | PRN
  Administered 2020-04-25: 5 mL via INTRA_ARTERIAL

## 2020-04-25 MED ORDER — FAMOTIDINE IN NACL 20-0.9 MG/50ML-% IV SOLN
INTRAVENOUS | Status: AC | PRN
  Administered 2020-04-25: 20 mg via INTRAVENOUS

## 2020-04-25 MED ORDER — ASPIRIN EC 81 MG PO TBEC: 81.0000 mg | DELAYED_RELEASE_TABLET | Freq: Every day | ORAL | Status: DC

## 2020-04-25 MED ORDER — FENTANYL CITRATE (PF) 100 MCG/2ML IJ SOLN
INTRAMUSCULAR | Status: AC
  Filled 2020-04-25: qty 2

## 2020-04-25 MED ORDER — SODIUM CHLORIDE 0.9 % WEIGHT BASED INFUSION: 3.0000 mL/kg/h | INTRAVENOUS | Status: DC

## 2020-04-25 MED ORDER — SODIUM CHLORIDE 0.9 % WEIGHT BASED INFUSION: 1.0000 mL/kg/h | INTRAVENOUS | Status: DC

## 2020-04-25 MED ORDER — FENTANYL CITRATE (PF) 100 MCG/2ML IJ SOLN
INTRAMUSCULAR | Status: DC | PRN
  Administered 2020-04-25: 50 ug via INTRAVENOUS
  Administered 2020-04-25: 25 ug via INTRAVENOUS

## 2020-04-25 MED ORDER — HEPARIN (PORCINE) IN NACL 1000-0.9 UT/500ML-% IV SOLN
INTRAVENOUS | Status: AC
  Filled 2020-04-25: qty 1000

## 2020-04-25 MED ORDER — SODIUM CHLORIDE 0.9 % IV SOLN: INTRAVENOUS | Status: DC

## 2020-04-25 MED ORDER — ATORVASTATIN CALCIUM 80 MG PO TABS: 80.0000 mg | ORAL_TABLET | Freq: Every day | ORAL | Status: DC

## 2020-04-25 MED ORDER — MORPHINE SULFATE (PF) 2 MG/ML IV SOLN
2.0000 mg | INTRAVENOUS | Status: DC | PRN
  Administered 2020-04-25: 2 mg via INTRAVENOUS

## 2020-04-25 MED ORDER — NITROGLYCERIN IN D5W 200-5 MCG/ML-% IV SOLN
INTRAVENOUS | Status: AC | PRN
  Administered 2020-04-25: 5 ug/min via INTRAVENOUS

## 2020-04-25 MED ORDER — HYDRALAZINE HCL 20 MG/ML IJ SOLN
INTRAMUSCULAR | Status: AC
  Filled 2020-04-25: qty 1

## 2020-04-25 MED ORDER — MIDAZOLAM HCL 2 MG/2ML IJ SOLN
INTRAMUSCULAR | Status: AC
  Filled 2020-04-25: qty 2

## 2020-04-25 MED ORDER — VERAPAMIL HCL 2.5 MG/ML IV SOLN
INTRAVENOUS | Status: AC
  Filled 2020-04-25: qty 2

## 2020-04-25 MED ORDER — MIDAZOLAM HCL 2 MG/2ML IJ SOLN
INTRAMUSCULAR | Status: DC | PRN
  Administered 2020-04-25: 1 mg via INTRAVENOUS

## 2020-04-25 MED ORDER — SODIUM CHLORIDE 0.9 % IV SOLN: INTRAVENOUS | Status: AC

## 2020-04-25 MED ORDER — CLOPIDOGREL BISULFATE 300 MG PO TABS
ORAL_TABLET | ORAL | Status: AC
  Filled 2020-04-25: qty 2

## 2020-04-25 SURGICAL SUPPLY — 17 items
BALLN SAPPHIRE 2.0X15 (BALLOONS) ×2
BALLN SAPPHIRE ~~LOC~~ 3.25X12 (BALLOONS) ×1 IMPLANT
BALLOON SAPPHIRE 2.0X15 (BALLOONS) IMPLANT
CATH OPTITORQUE TIG 4.0 5F (CATHETERS) ×1 IMPLANT
CATH VISTA GUIDE 6FR XBLAD3.5 (CATHETERS) ×1 IMPLANT
DEVICE RAD COMP TR BAND LRG (VASCULAR PRODUCTS) ×1 IMPLANT
GLIDESHEATH SLEND A-KIT 6F 22G (SHEATH) ×1 IMPLANT
GUIDEWIRE INQWIRE 1.5J.035X260 (WIRE) IMPLANT
INQWIRE 1.5J .035X260CM (WIRE) ×2
KIT ENCORE 26 ADVANTAGE (KITS) ×1 IMPLANT
KIT HEART LEFT (KITS) ×2 IMPLANT
PACK CARDIAC CATHETERIZATION (CUSTOM PROCEDURE TRAY) ×2 IMPLANT
STENT RESOLUTE ONYX 2.75X38 (Permanent Stent) ×1 IMPLANT
TRANSDUCER W/STOPCOCK (MISCELLANEOUS) ×2 IMPLANT
TUBING CIL FLEX 10 FLL-RA (TUBING) ×2 IMPLANT
WIRE ASAHI PROWATER 180CM (WIRE) ×3 IMPLANT
WIRE HI TORQ VERSACORE-J 145CM (WIRE) ×1 IMPLANT

## 2020-04-25 NOTE — Progress Notes (Signed)
ANTICOAGULATION CONSULT NOTE - Initial Consult  Pharmacy Consult for heparin Indication: chest pain/ACS  Allergies  Allergen Reactions  . Amitriptyline     Made me feel strange  . Keflex [Cephalexin]     Stomach pain  . Topiramate     Made me feel strange    Patient Measurements: Height: 5\' 9"  (175.3 cm) Weight: 86.2 kg (190 lb) IBW/kg (Calculated) : 70.7 Heparin Dosing Weight: 88.5kg  Vital Signs: Temp: 97.4 F (36.3 C) (07/19 0712) Temp Source: Oral (07/19 0712) BP: 133/80 (07/19 0712) Pulse Rate: 58 (07/19 0712)  Labs: Recent Labs    04/23/20 0810 04/23/20 1625 04/23/20 1717 04/24/20 0532  HGB 14.9 15.0  --   --   HCT 44.2 44.9  --   --   PLT 157 165  --   --   CREATININE 0.91 0.98  --  1.01  TROPONINIHS 2 3 4   --     Estimated Creatinine Clearance: 81.4 mL/min (by C-G formula based on SCr of 1.01 mg/dL).   Medical History: Past Medical History:  Diagnosis Date  . Anxiety   . Cancer ([place])   . 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 Depression   . GERD (gastroesophageal reflux disease) 04/24/2020  . Headache   . Hyperlipidemia 04/24/2020    Medications:  Scheduled:  . [START ON 04/26/2020] aspirin EC  81 mg Oral Daily  . atorvastatin  80 mg Oral Daily  . gabapentin  300 mg Oral TID  . heparin  5,000 Units Subcutaneous Q8H  . pantoprazole  40 mg Oral Q0600  . sodium chloride flush  3 mL Intravenous Q12H   Infusions:  . sodium chloride    . sodium chloride 100 mL/hr at 04/25/20 0830  . [START ON 04/26/2020] sodium chloride     Followed by  . [START ON 04/26/2020] sodium chloride      Assessment: 64 year old male found to have possible LAD stenosis on cardiac CT with continued chest pain. CBC WNL, no anticoagulants reported on PTA medication list. Did not receive subcu heparin this morning 7/19.   Goal of Therapy:  Heparin level 0.3-0.7 units/ml Monitor platelets by anticoagulation protocol: Yes   Plan:  Give  4000 units bolus x 1 Start heparin infusion at 1000 units/hr Check anti-Xa level in 6 hours and daily while on heparin Continue to monitor H&H and platelets  Carolin Guernsey, PharmD  PGY1 Pharmacy Resident 04/25/2020,10:55 AM

## 2020-04-25 NOTE — Plan of Care (Signed)
  Problem: Education: Goal: Understanding of cardiac disease, CV risk reduction, and recovery process will improve Outcome: Progressing Goal: Individualized Educational Video(s) Outcome: Progressing   Problem: Activity: Goal: Ability to tolerate increased activity will improve Outcome: Progressing   Problem: Cardiac: Goal: Ability to achieve and maintain adequate cardiovascular perfusion will improve Outcome: Progressing   Problem: Health Behavior/Discharge Planning: Goal: Ability to safely manage health-related needs after discharge will improve Outcome: Progressing   Problem: Health Behavior/Discharge Planning: Goal: Ability to manage health-related needs will improve Outcome: Progressing   Problem: Clinical Measurements: Goal: Ability to maintain clinical measurements within normal limits will improve Outcome: Progressing Goal: Will remain free from infection Outcome: Progressing Goal: Diagnostic test results will improve Outcome: Progressing Goal: Respiratory complications will improve Outcome: Progressing Goal: Cardiovascular complication will be avoided Outcome: Progressing

## 2020-04-25 NOTE — H&P (View-Only) (Signed)
Progress Note  Patient Name: [place] Pile Date of Encounter: 04/25/2020  Primary Cardiologist: Fransico Him, MD  Subjective   He continues to have mild midsternal chest discomfort as well as medial left arm pain-extending to the forearm.  This is not worsened with palpation, position changes, or deep breathing.  He is n.p.o. for catheterization this morning.  Inpatient Medications    Scheduled Meds: . [START ON 04/26/2020] aspirin  81 mg Oral Pre-Cath  . [START ON 04/26/2020] aspirin EC  81 mg Oral Daily  . gabapentin  300 mg Oral TID  . heparin  5,000 Units Subcutaneous Q8H  . sodium chloride flush  3 mL Intravenous Q12H   Continuous Infusions: . sodium chloride    . [START ON 04/26/2020] sodium chloride     Followed by  . [START ON 04/26/2020] sodium chloride     PRN Meds: sodium chloride, acetaminophen, nitroGLYCERIN, ondansetron (ZOFRAN) IV, sodium chloride flush   Vital Signs    Vitals:   04/24/20 1320 04/25/20 0356 04/25/20 0618 04/25/20 0712  BP: 113/67 106/67  133/80  Pulse: 60 (!) 57  (!) 58  Resp: 14 16  17   Temp: 98.1 F (36.7 C) 98.1 F (36.7 C)  (!) 97.4 F (36.3 C)  TempSrc: Oral Oral  Oral  SpO2: 96% 96%  97%  Weight:   86.2 kg   Height:       No intake or output data in the 24 hours ending 04/25/20 0727 Filed Weights   04/23/20 1348 04/24/20 0603 04/25/20 0618  Weight: 88.7 kg 87 kg 86.2 kg    Physical Exam   GEN: Well nourished, well developed, in no acute distress.  HEENT: Grossly normal.  Neck: Supple, no JVD, carotid bruits, or masses. Cardiac: RRR, no murmurs, rubs, or gallops. No clubbing, cyanosis, edema.  Radials/DP/PT 2+ and equal bilaterally.  Normal Allen's on right. Respiratory:  Respirations regular and unlabored, clear to auscultation bilaterally. GI: Soft, nontender, nondistended, BS + x 4. MS: no deformity or atrophy. Skin: warm and dry, no rash. Neuro:  Strength and sensation are intact. Psych: AAOx3.  Flat  affect.  Labs    Chemistry Recent Labs  Lab 04/23/20 0810 04/23/20 1625 04/24/20 0532  NA 140  --  136  K 3.9  --  4.0  CL 105  --  102  CO2 26  --  26  GLUCOSE 99  --  101*  BUN 22  --  15  CREATININE 0.91 0.98 1.01  CALCIUM 9.2  --  9.5  GFRNONAA >60 >60 >60  GFRAA >60 >60 >60  ANIONGAP 9  --  8     Hematology Recent Labs  Lab 04/23/20 0810 04/23/20 1625  WBC 6.2 5.8  RBC 4.79 4.81  HGB 14.9 15.0  HCT 44.2 44.9  MCV 92.3 93.3  MCH 31.1 31.2  MCHC 33.7 33.4  RDW 12.2 12.3  PLT 157 165    Cardiac Enzymes  Recent Labs  Lab 04/23/20 0810 04/23/20 1625 04/23/20 1717  TROPONINIHS 2 3 4      Lab Results  Component Value Date   CHOL 242 (H) 04/24/2020   HDL 41 04/24/2020   LDLCALC 178 (H) 04/24/2020   TRIG 116 04/24/2020   CHOLHDL 5.9 04/24/2020    Radiology    DG Chest 2 View  Result Date: 04/23/2020 CLINICAL DATA:  Chest pain.  Elevated blood pressure. EXAM: CHEST - 2 VIEW COMPARISON:  03/01/2016 FINDINGS: Mild right hemidiaphragm elevation. Numerous leads and wires  project over the chest. Midline trachea. Normal heart size and mediastinal contours. No pleural effusion or pneumothorax. Clear lungs. IMPRESSION: No acute cardiopulmonary disease. Electronically Signed   By: Abigail Miyamoto M.D.   On: 04/23/2020 09:07   Telemetry    Regular sinus rhythm, 70s to 80s- Personally Reviewed  Cardiac Studies   2D Echocardiogram 7.18.2021   1. Left ventricular ejection fraction, by estimation, is 65 to 70%. The  left ventricle has normal function. The left ventricle has no regional  wall motion abnormalities. Left ventricular diastolic parameters were  normal.   2. Right ventricular systolic function is normal. The right ventricular  size is normal.   3. The mitral valve is normal in structure. Trivial mitral valve  regurgitation.   4. The aortic valve is normal in structure. Aortic valve regurgitation is  trivial.  _____________   Coronary CT Angio w/  FFR 7.18.2021  1. Left Main: No significant stenosis. LM FFR = 0.99.   2. LAD: Possible significant stenosis Mid LAD. Proximal FFR = 0.98, Mid FFR = 0.72, Distal FFR = 0.61.   3. LCX: No significant stenosis. Proximal FFR = 0.99, Mid FFR = 0.91, Distal FFR = 0.86.   4. RCA: No significant stenosis. Proximal FFR = 0.98, Mid FFR = 0.93, Distal FFR = 0.91.   IMPRESSION: 1. CT Coronary Flow analysis demonstrates possible flow limiting lesion in the mid LAD.   2.    Recommend Cardiac catheterization. _____________   Patient Profile     64 y.o. male with lymphoma, chronic HA 2/2 CSF leak, GERD, white coat hypertension, untreated HL, FHx of CAD.  He presented with ongoing chest pain > 48 hours in duration.  High sensitivity Troponin levels have been neg. Echo w/ nl EF. Cor CTA w/ mid/distal LAD dzs.  Assessment & Plan    1.  Unstable Angina: Patient admitted July 17 with chest discomfort rating to the left arm that started 2 nights prior to admission during sexual intercourse.  Discomfort has been mild and persistent throughout the weekend and despite this, high-sensitivity troponins were normal.  Echo showed normal LV function without any significant valvular abnormalities.  Coronary CT angiography performed yesterday showed possible mid LAD stenosis with an FFR of 0.72 in the midportion of the vessel and an FFR of 0.61 in the distal portion of the vessel.  Otherwise no significant disease.  He is scheduled for diagnostic catheterization this morning.  His wife is present with him in the room and all questions have been answered.  The patient understands that risks include but are not limited to stroke (1 in 1000), death (1 in 51), kidney failure [usually temporary] (1 in 500), bleeding (1 in 200), allergic reaction [possibly serious] (1 in 200), and agrees to proceed.  He is not currently receiving IV fluids and I will order saline at 100 an hour right now.  Creatinine stable post contrast  load yesterday.  Continue aspirin in the setting of CT findings, I will add high potency statin therapy.  Given atypical nature of the ongoing pain, I will also add a PPI.  2.  HL: LDL of 178.  Given findings on coronary CTA, adding Lipitor 80 mg daily.  3.  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  On gabapentin.  Signed, Murray Hodgkins, NP  04/25/2020, 7:27 AM    For questions or updates, please contact   Please consult [email or web address] for contact info under Cardiology/STEMI.   The patient was seen,  examined and discussed with Ignacia Bayley, NP and agree as above.  The patient continues to have retrosternal chest pressure with radiation to his arm, I will start Heparin drip for UA. His ECG shows NSR, normal ECG, vitals are normal, cardiac cath is schedule in the afternoon. I will start atorvastatin 80 mg po daily.  Ena Dawley, MD 04/25/2020

## 2020-04-25 NOTE — Progress Notes (Addendum)
Progress Note  Patient Name: Ronald Tapia Date of Encounter: 04/25/2020  Primary Cardiologist: Fransico Him, MD  Subjective   He continues to have mild midsternal chest discomfort as well as medial left arm pain-extending to the forearm.  This is not worsened with palpation, position changes, or deep breathing.  He is n.p.o. for catheterization this morning.  Inpatient Medications    Scheduled Meds: . [START ON 04/26/2020] aspirin  81 mg Oral Pre-Cath  . [START ON 04/26/2020] aspirin EC  81 mg Oral Daily  . gabapentin  300 mg Oral TID  . heparin  5,000 Units Subcutaneous Q8H  . sodium chloride flush  3 mL Intravenous Q12H   Continuous Infusions: . sodium chloride    . [START ON 04/26/2020] sodium chloride     Followed by  . [START ON 04/26/2020] sodium chloride     PRN Meds: sodium chloride, acetaminophen, nitroGLYCERIN, ondansetron (ZOFRAN) IV, sodium chloride flush   Vital Signs    Vitals:   04/24/20 1320 04/25/20 0356 04/25/20 0618 04/25/20 0712  BP: 113/67 106/67  133/80  Pulse: 60 (!) 57  (!) 58  Resp: 14 16  17   Temp: 98.1 F (36.7 C) 98.1 F (36.7 C)  (!) 97.4 F (36.3 C)  TempSrc: Oral Oral  Oral  SpO2: 96% 96%  97%  Weight:   86.2 kg   Height:       No intake or output data in the 24 hours ending 04/25/20 0727 Filed Weights   04/23/20 1348 04/24/20 0603 04/25/20 0618  Weight: 88.7 kg 87 kg 86.2 kg    Physical Exam   GEN: Well nourished, well developed, in no acute distress.  HEENT: Grossly normal.  Neck: Supple, no JVD, carotid bruits, or masses. Cardiac: RRR, no murmurs, rubs, or gallops. No clubbing, cyanosis, edema.  Radials/DP/PT 2+ and equal bilaterally.  Normal Allen's on right. Respiratory:  Respirations regular and unlabored, clear to auscultation bilaterally. GI: Soft, nontender, nondistended, BS + x 4. MS: no deformity or atrophy. Skin: warm and dry, no rash. Neuro:  Strength and sensation are intact. Psych: AAOx3.  Flat  affect.  Labs    Chemistry Recent Labs  Lab 04/23/20 0810 04/23/20 1625 04/24/20 0532  NA 140  --  136  K 3.9  --  4.0  CL 105  --  102  CO2 26  --  26  GLUCOSE 99  --  101*  BUN 22  --  15  CREATININE 0.91 0.98 1.01  CALCIUM 9.2  --  9.5  GFRNONAA >60 >60 >60  GFRAA >60 >60 >60  ANIONGAP 9  --  8     Hematology Recent Labs  Lab 04/23/20 0810 04/23/20 1625  WBC 6.2 5.8  RBC 4.79 4.81  HGB 14.9 15.0  HCT 44.2 44.9  MCV 92.3 93.3  MCH 31.1 31.2  MCHC 33.7 33.4  RDW 12.2 12.3  PLT 157 165    Cardiac Enzymes  Recent Labs  Lab 04/23/20 0810 04/23/20 1625 04/23/20 1717  TROPONINIHS 2 3 4      Lab Results  Component Value Date   CHOL 242 (H) 04/24/2020   HDL 41 04/24/2020   LDLCALC 178 (H) 04/24/2020   TRIG 116 04/24/2020   CHOLHDL 5.9 04/24/2020    Radiology    DG Chest 2 View  Result Date: 04/23/2020 CLINICAL DATA:  Chest pain.  Elevated blood pressure. EXAM: CHEST - 2 VIEW COMPARISON:  03/01/2016 FINDINGS: Mild right hemidiaphragm elevation. Numerous leads and wires  project over the chest. Midline trachea. Normal heart size and mediastinal contours. No pleural effusion or pneumothorax. Clear lungs. IMPRESSION: No acute cardiopulmonary disease. Electronically Signed   By: Abigail Miyamoto M.D.   On: 04/23/2020 09:07   Telemetry    Regular sinus rhythm, 70s to 80s- Personally Reviewed  Cardiac Studies   2D Echocardiogram 7.18.2021   1. Left ventricular ejection fraction, by estimation, is 65 to 70%. The  left ventricle has normal function. The left ventricle has no regional  wall motion abnormalities. Left ventricular diastolic parameters were  normal.   2. Right ventricular systolic function is normal. The right ventricular  size is normal.   3. The mitral valve is normal in structure. Trivial mitral valve  regurgitation.   4. The aortic valve is normal in structure. Aortic valve regurgitation is  trivial.  _____________   Coronary CT Angio w/  FFR 7.18.2021  1. Left Main: No significant stenosis. LM FFR = 0.99.   2. LAD: Possible significant stenosis Mid LAD. Proximal FFR = 0.98, Mid FFR = 0.72, Distal FFR = 0.61.   3. LCX: No significant stenosis. Proximal FFR = 0.99, Mid FFR = 0.91, Distal FFR = 0.86.   4. RCA: No significant stenosis. Proximal FFR = 0.98, Mid FFR = 0.93, Distal FFR = 0.91.   IMPRESSION: 1. CT Coronary Flow analysis demonstrates possible flow limiting lesion in the mid LAD.   2.    Recommend Cardiac catheterization. _____________   Patient Profile     64 y.o. male with lymphoma, chronic HA 2/2 CSF leak, GERD, white coat hypertension, untreated HL, FHx of CAD.  He presented with ongoing chest pain > 48 hours in duration.  High sensitivity Troponin levels have been neg. Echo w/ nl EF. Cor CTA w/ mid/distal LAD dzs.  Assessment & Plan    1.  Unstable Angina: Patient admitted July 17 with chest discomfort rating to the left arm that started 2 nights prior to admission during sexual intercourse.  Discomfort has been mild and persistent throughout the weekend and despite this, high-sensitivity troponins were normal.  Echo showed normal LV function without any significant valvular abnormalities.  Coronary CT angiography performed yesterday showed possible mid LAD stenosis with an FFR of 0.72 in the midportion of the vessel and an FFR of 0.61 in the distal portion of the vessel.  Otherwise no significant disease.  He is scheduled for diagnostic catheterization this morning.  His wife is present with him in the room and all questions have been answered.  The patient understands that risks include but are not limited to stroke (1 in 1000), death (1 in 53), kidney failure [usually temporary] (1 in 500), bleeding (1 in 200), allergic reaction [possibly serious] (1 in 200), and agrees to proceed.  He is not currently receiving IV fluids and I will order saline at 100 an hour right now.  Creatinine stable post contrast  load yesterday.  Continue aspirin in the setting of CT findings, I will add high potency statin therapy.  Given atypical nature of the ongoing pain, I will also add a PPI.  2.  HL: LDL of 178.  Given findings on coronary CTA, adding Lipitor 80 mg daily.  3.  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  On gabapentin.  Signed, Murray Hodgkins, NP  04/25/2020, 7:27 AM    For questions or updates, please contact   Please consult [email or web address] for contact info under Cardiology/STEMI.   The patient was seen,  examined and discussed with Ignacia Bayley, NP and agree as above.  The patient continues to have retrosternal chest pressure with radiation to his arm, I will start Heparin drip for UA. His ECG shows NSR, normal ECG, vitals are normal, cardiac cath is schedule in the afternoon. I will start atorvastatin 80 mg po daily.  Ena Dawley, MD 04/25/2020

## 2020-04-25 NOTE — Discharge Summary (Addendum)
Discharge Summary    Patient ID: Ronald Tapia MRN: [id number]; DOB: 28-Sep-1956  Admit date: 04/23/2020 Discharge date: 04/26/2020  Primary Care Provider: Osie Cheeks, PA-C  Primary Cardiologist: Pt will f/u in our [facility]  Primary Electrophysiologist:  None   Discharge Diagnoses    Principal Problem:   Midsternal chest pain  **Cardiac Cath this admission w/ severe proximal/mid LAD disease status post PCI and drug-eluting stent placement.  Active Problems:   Hyperlipidemia   GERD (gastroesophageal reflux disease)   Chronic headaches  Diagnostic Studies/Procedures    2D Echocardiogram 7.18.2021   1. Left ventricular ejection fraction, by estimation, is 65 to 70%. The  left ventricle has normal function. The left ventricle has no regional  wall motion abnormalities. Left ventricular diastolic parameters were  normal.   2. Right ventricular systolic function is normal. The right ventricular  size is normal.   3. The mitral valve is normal in structure. Trivial mitral valve  regurgitation.   4. The aortic valve is normal in structure. Aortic valve regurgitation is  trivial.  _____________  Coronary CT Angio w/ FFR 7.18.2021  1. Left Main: No significant stenosis. LM FFR = 0.99. 2. LAD: Possible significant stenosis Mid LAD. Proximal FFR = 0.98, Mid FFR = 0.72, Distal FFR = 0.61. 3. LCX: No significant stenosis. Proximal FFR = 0.99, Mid FFR = 0.91, Distal FFR = 0.86. 4. RCA: No significant stenosis. Proximal FFR = 0.98, Mid FFR = 0.93, Distal FFR = 0.91.   IMPRESSION: 1. CT Coronary Flow analysis demonstrates possible flow limiting lesion in the mid LAD. 2.    Recommend Cardiac catheterization. _____________    Cardiac Catheterization 7.19.2021 Left main normal Left Anterior Descending Prox LAD to Mid LAD lesion is 90% stenosed. Vessel is the culprit lesion. The lesion is calcified. Mid LAD lesion is 75% stenosed.   **The LAD was successfully  treated with a 2.75 x 38 mm resolute Onyx drug-eluting stent** Left circumflex normal Right coronary artery normal _____________   History of Present Illness     Ronald Tapia is a 64 y.o. 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  He was in his usual state of health until 15 July, when he began to experience chest pain radiating to the left shoulder and arm.  This persisted over the subsequent 2 days, prompting him to present to the med Center at [facility] on July 17.  There, ECG showed no significant ST or T changes and troponins were normal.  Chest x-ray was unremarkable.  In the setting of ongoing chest pain, he was placed on heparin therapy and transferred to [facility] for further evaluation.  Hospital Course     Consultants: None  Following arrival to [facility], troponins remain normal.  He continued to report mild chest and left arm discomfort.  Echocardiogram was performed and showed normal LV function without any significant valvular abnormalities.  Coronary CT angiography was performed on July 18 and showed possible significant stenosis in the mid LAD with an FFR of 0.72 in the mid vessel and 0.61 in the distal vessel.  In this setting, diagnostic catheterization was recommended.    Patient underwent diagnostic catheterization on July 19 confirming findings on coronary CTA, with a severe 90% proximal to mid LAD stenosis and 75% mid LAD stenosis.  Coronary arteries are otherwise normal.  He underwent successful PCI and stenting of  the LAD with a 2.75 x 38 mm resolute Onyx drug-eluting stent.  He had persistent chest pain throughout the procedure and afterwards and was transferred to the coronary intensive care unit where chest pain slowly but surely eased off.  ECG remained normal.  He did have mild chest discomfort this morning but this was  significantly improved compared to what he experienced yesterday or over the weekend.  With ambulation, chest pain has resolved.  He will be discharged home today in good condition on aspirin, Plavix, and statin therapy.  We also added PPI in the setting of prolonged chest pain and normal cardiac enzymes.  Pt will follow-up in our [place] office within the next 2 wks and then we will arrange for future office follow-up in [facility].  Did the patient have an acute coronary syndrome (MI, NSTEMI, STEMI, etc) this admission?:  No                               Did the patient have a percutaneous coronary intervention (stent / angioplasty)?:  Yes.     Cath/PCI Registry Performance & Quality Measures: 1. Aspirin prescribed? - Yes 2. ADP Receptor Inhibitor (Plavix/Clopidogrel, Brilinta/Ticagrelor or Effient/Prasugrel) prescribed (includes medically managed patients)? - Yes 3. High Intensity Statin (Lipitor 40-80mg  or Crestor 20-40mg ) prescribed? - Yes 4. For EF <40%, was ACEI/ARB prescribed? - Not Applicable (EF >/= 23%) 5. For EF <40%, Aldosterone Antagonist (Spironolactone or Eplerenone) prescribed? - Not Applicable (EF >/= 55%) 6. Cardiac Rehab Phase II ordered? - Yes   _____________  Discharge Vitals Blood pressure 136/79, pulse 64, temperature 98.2 F (36.8 C), temperature source Oral, resp. rate 16, height 5\' 9"  (1.753 m), weight 86.2 kg, SpO2 100 %.  Filed Weights   04/23/20 1348 04/24/20 0603 04/25/20 0618  Weight: 88.7 kg 87 kg 86.2 kg    Labs & Radiologic Studies    CBC Recent Labs    04/23/20 1625 04/26/20 0307  WBC 5.8 6.5  HGB 15.0 14.7  HCT 44.9 43.3  MCV 93.3 92.9  PLT 165 732   Basic Metabolic Panel Recent Labs    04/24/20 0532 04/26/20 0307  NA 136 136  K 4.0 3.7  CL 102 104  CO2 26 24  GLUCOSE 101* 92  BUN 15 16  CREATININE 1.01 0.91  CALCIUM 9.5 9.0    High Sensitivity Troponin:   Recent Labs  Lab 04/23/20 0810 04/23/20 1625 04/23/20 1717    TROPONINIHS 2 3 4     Fasting Lipid Panel Recent Labs    04/24/20 0532  CHOL 242*  HDL 41  LDLCALC 178*  TRIG 116  CHOLHDL 5.9  _____________  DG Chest 2 View  Result Date: 04/23/2020 CLINICAL DATA:  Chest pain.  Elevated blood pressure. EXAM: CHEST - 2 VIEW COMPARISON:  03/01/2016 FINDINGS: Mild right hemidiaphragm elevation. Numerous leads and wires project over the chest. Midline trachea. Normal heart size and mediastinal contours. No pleural effusion or pneumothorax. Clear lungs. IMPRESSION: No acute cardiopulmonary disease. Electronically Signed   By: Abigail Miyamoto M.D.   On: 04/23/2020 09:07   CT CORONARY MORPH W/CTA COR W/SCORE W/CA W/CM &/OR WO/CM  Addendum Date: 04/25/2020   ADDENDUM REPORT: 04/25/2020 09:25 EXAM: OVER-READ INTERPRETATION  CT CHEST The following report is an over-read performed by radiologist Dr. Rebekah Chesterfield [facility] on 04/25/2020. This over-read does not include interpretation of cardiac or coronary anatomy or pathology. The coronary calcium score  and cardiac CTA interpretation by the cardiologist is attached. COMPARISON:  Chest CT 01/25/2020. FINDINGS: Aortic atherosclerosis. Mild linear scarring in the anterior aspect of the right lower lobe. Within the visualized portions of the thorax there are no suspicious appearing pulmonary nodules or masses, there is no acute consolidative airspace disease, no pleural effusions, no pneumothorax and no lymphadenopathy. Visualized portions of the upper abdomen are unremarkable. There are no aggressive appearing lytic or blastic lesions noted in the visualized portions of the skeleton. IMPRESSION: 1.  Aortic Atherosclerosis (ICD10-I70.0). Electronically Signed   By: Vinnie Langton M.D.   On: 04/25/2020 09:25   Disposition   Pt is being discharged home today in good condition.  Follow-up Plans & Appointments     Follow-up Information    Charlie Pitter, PA-C Follow up on 05/11/2020.   Specialties:  Cardiology, Radiology Contact information: [address] [place] [address] [place] [address] [phone]              Discharge Instructions    Amb Referral to Cardiac Rehabilitation   Complete by: As directed    Referring to [place] CRP 2   Diagnosis: Coronary Stents   After initial evaluation and assessments completed: Virtual Based Care may be provided alone or in conjunction with Phase 2 Cardiac Rehab based on patient barriers.: Yes   Call MD for:  difficulty breathing, headache or visual disturbances   Complete by: As directed    Call MD for:  redness, tenderness, or signs of infection (pain, swelling, redness, odor or green/yellow discharge around incision site)   Complete by: As directed    Call MD for:  severe uncontrolled pain   Complete by: As directed    Diet - low sodium heart healthy   Complete by: As directed    Increase activity slowly   Complete by: As directed       Discharge Medications   Allergies as of 04/26/2020      Reactions   Amitriptyline    Made me feel strange   Keflex [cephalexin]    Stomach pain   Topiramate    Made me feel strange      Medication List    STOP taking these medications   OVER THE COUNTER MEDICATION   VITAMIN K2 PO     TAKE these medications   ascorbic acid 500 MG tablet Commonly known as: VITAMIN C Take 500 mg by mouth daily.   aspirin 81 MG chewable tablet Chew 1 tablet (81 mg total) by mouth daily. Start taking on: April 27, 2020   atorvastatin 80 MG tablet Commonly known as: LIPITOR Take 1 tablet (80 mg total) by mouth daily. Start taking on: April 27, 2020   clopidogrel 75 MG tablet Commonly known as: PLAVIX Take 1 tablet (75 mg total) by mouth daily with breakfast. Start taking on: April 27, 2020   DIGESTIVE ENZYME PO Take 1 tablet by mouth in the morning and at bedtime.   gabapentin 300 MG capsule Commonly known as: NEURONTIN Take 300 mg by mouth 3 (three) times daily.   MAGNESIUM  GLYCINATE PLUS PO Take 1 tablet by mouth daily. 400mg    nitroGLYCERIN 0.4 MG SL tablet Commonly known as: NITROSTAT Place 1 tablet (0.4 mg total) under the tongue every 5 (five) minutes x 3 doses as needed for chest pain.   pantoprazole 40 MG tablet Commonly known as: PROTONIX Take 1 tablet (40 mg total) by mouth daily at 6 (six) AM. Start taking on:  April 27, 2020   VITAMIN D3 PO Take 1 tablet by mouth daily. 75000IU         Outstanding Labs/Studies   F/u lipids and ALT in 8-12 wks.  Duration of Discharge Encounter   Greater than 30 minutes including physician time.  Signed, Murray Hodgkins, NP 04/26/2020, 3:08 PM

## 2020-04-25 NOTE — Interval H&P Note (Signed)
Cath Lab Visit (complete for each Cath Lab visit)  Clinical Evaluation Leading to the Procedure:   ACS: No.  Non-ACS:    Anginal Classification: CCS II  Anti-ischemic medical therapy: No Therapy  Non-Invasive Test Results: No non-invasive testing performed  Prior CABG: No previous CABG      History and Physical Interval Note:  04/25/2020 4:26 PM  Ronald Tapia  has presented today for surgery, with the diagnosis of unstable angina.  The various methods of treatment have been discussed with the patient and family. After consideration of risks, benefits and other options for treatment, the patient has consented to  Procedure(s): LEFT HEART CATH AND CORONARY ANGIOGRAPHY (N/A) as a surgical intervention.  The patient's history has been reviewed, patient examined, no change in status, stable for surgery.  I have reviewed the patient's chart and labs.  Questions were answered to the patient's satisfaction.     Quay Burow

## 2020-04-26 ENCOUNTER — Encounter (HOSPITAL_COMMUNITY): Payer: Self-pay | Admitting: Cardiovascular Disease

## 2020-04-26 ENCOUNTER — Encounter (HOSPITAL_COMMUNITY): Payer: Self-pay

## 2020-04-26 ENCOUNTER — Encounter (HOSPITAL_COMMUNITY): Payer: Self-pay | Admitting: *Deleted

## 2020-04-26 DIAGNOSIS — R0789 Other chest pain: Secondary | ICD-10-CM

## 2020-04-26 DIAGNOSIS — I25118 Atherosclerotic heart disease of native coronary artery with other forms of angina pectoris: Secondary | ICD-10-CM

## 2020-04-26 DIAGNOSIS — I10 Essential (primary) hypertension: Secondary | ICD-10-CM

## 2020-04-26 LAB — CBC
HCT: 43.3 % (ref 39.0–52.0)
Hemoglobin: 14.7 g/dL (ref 13.0–17.0)
MCH: 31.5 pg (ref 26.0–34.0)
MCHC: 33.9 g/dL (ref 30.0–36.0)
MCV: 92.9 fL (ref 80.0–100.0)
Platelets: 152 10*3/uL (ref 150–400)
RBC: 4.66 MIL/uL (ref 4.22–5.81)
RDW: 12.5 % (ref 11.5–15.5)
WBC: 6.5 10*3/uL (ref 4.0–10.5)
nRBC: 0 % (ref 0.0–0.2)

## 2020-04-26 LAB — BASIC METABOLIC PANEL
Anion gap: 8 (ref 5–15)
BUN: 16 mg/dL (ref 8–23)
CO2: 24 mmol/L (ref 22–32)
Calcium: 9 mg/dL (ref 8.9–10.3)
Chloride: 104 mmol/L (ref 98–111)
Creatinine, Ser: 0.91 mg/dL (ref 0.61–1.24)
GFR calc Af Amer: >60 mL/min (ref >60)
GFR calc non Af Amer: >60 mL/min (ref >60)
Glucose, Bld: 92 mg/dL (ref 70–99)
Potassium: 3.7 mmol/L (ref 3.5–5.1)
Sodium: 136 mmol/L (ref 135–145)

## 2020-04-26 MED ORDER — CLOPIDOGREL BISULFATE 75 MG PO TABS: 75.0000 mg | ORAL_TABLET | Freq: Every day | ORAL | 6 refills | Status: DC

## 2020-04-26 MED ORDER — NITROGLYCERIN 0.4 MG SL SUBL: 0.4000 mg | SUBLINGUAL_TABLET | SUBLINGUAL | 3 refills | Status: DC | PRN

## 2020-04-26 MED ORDER — ASPIRIN 81 MG PO CHEW: 81.0000 mg | CHEWABLE_TABLET | Freq: Every day | ORAL | 6 refills | Status: DC

## 2020-04-26 MED ORDER — PANTOPRAZOLE SODIUM 40 MG PO TBEC: 40.0000 mg | DELAYED_RELEASE_TABLET | Freq: Every day | ORAL | 6 refills | Status: DC

## 2020-04-26 MED ORDER — ALUM & MAG HYDROXIDE-SIMETH 200-200-20 MG/5ML PO SUSP
30.0000 mL | ORAL | Status: DC | PRN
  Administered 2020-04-26: 30 mL via ORAL
  Filled 2020-04-26: qty 30

## 2020-04-26 MED ORDER — ATORVASTATIN CALCIUM 80 MG PO TABS: 80.0000 mg | ORAL_TABLET | Freq: Every day | ORAL | 6 refills | Status: DC

## 2020-04-26 NOTE — Discharge Instructions (Signed)
**PLEASE REMEMBER TO BRING ALL OF YOUR MEDICATIONS TO EACH OF YOUR FOLLOW-UP OFFICE VISITS.  NO HEAVY LIFTING OR SEXUAL ACTIVITY X 7 DAYS. NO DRIVING X 3-5 DAYS. NO SOAKING BATHS, HOT TUBS, POOLS, ETC., X 7 DAYS.   Radial Site Care Refer to this sheet in the next few weeks. These instructions provide you with information on caring for yourself after your procedure. Your caregiver may also give you more specific instructions. Your treatment has been planned according to current medical practices, but problems sometimes occur. Call your caregiver if you have any problems or questions after your procedure. HOME CARE INSTRUCTIONS  You may shower the day after the procedure.Remove the bandage (dressing) and gently wash the site with plain soap and water.Gently pat the site dry.   Do not apply powder or lotion to the site.   Do not submerge the affected site in water for 3 to 5 days.   Inspect the site at least twice daily.   Do not flex or bend the affected arm for 24 hours.   No lifting over 5 pounds (2.3 kg) for 5 days after your procedure.   Do not drive home if you are discharged the same day of the procedure. Have someone else drive you.   What to expect:  Any bruising will usually fade within 1 to 2 weeks.   Blood that collects in the tissue (hematoma) may be painful to the touch. It should usually decrease in size and tenderness within 1 to 2 weeks.  SEEK IMMEDIATE MEDICAL CARE IF:  You have unusual pain at the radial site.   You have redness, warmth, swelling, or pain at the radial site.   You have drainage (other than a small amount of blood on the dressing).   You have chills.   You have a fever or persistent symptoms for more than 72 hours.   You have a fever and your symptoms suddenly get worse.   Your arm becomes pale, cool, tingly, or numb.   You have heavy bleeding from the site. Hold pressure on the site.      10 Habits of Highly Healthy People  [facility] wants to help you get well and stay well.  Live a longer, healthier life by practicing healthy habits every day.  1.  Visit your primary care provider regularly. 2.  Make time for family and friends.  Healthy relationships are important. 3.  Take medications as directed by your provider. 4.  Maintain a healthy weight and a trim waistline. 5.  Eat healthy meals and snacks, rich in fruits, vegetables, whole grains, and lean proteins. 6.  Get moving every day - aim for 150 minutes of moderate physical activity each week. 7.  Don't smoke. 8.  Avoid alcohol or drink in moderation. 9.  Manage stress through meditation or mindful relaxation. 10.  Get seven to nine hours of quality sleep each night.  Want more information on healthy habits?  To learn more about these and other healthy habits, visit [email or web address]. _____________       You have received care from [facility] during this hospital stay and we look forward to continuing to provide you with excellent care in our office settings after you've left the hospital.  In order to assure a smoother transition to home following your discharge from the hospital, we will likely have you see one of our nurse practitioners or physician assistants within a few weeks of discharge.  Our   advanced practice providers work closely with your physician in order to address all of your heart's needs in a timely manner.  More information about all of our providers may be found here: [email or web address]  Please plan to bring all of your prescriptions to your follow-up appointment and don't hesitate to contact us with questions or concerns.  [facility] [place] - [phone] [facility] [place] - [phone] [facility] [address] - [phone] [facility] [place] - [phone] [facility] [place] - [phone] [facility] [place] -  [phone] [facility] [place] - [phone] [facility] [place] - [phone] [facility]  - [phone]    

## 2020-04-26 NOTE — Progress Notes (Addendum)
Progress Note  Patient Name: Ronald Tapia Date of Encounter: 04/26/2020  Primary Cardiologist: Prefers to f/u in [place]  Subjective   Had significant chest pain throughout PCI and post PCI yesterday.  Currently reports 0.5/10 chest and left arm discomfort.  No dyspnea.  Says he did not sleep well.  Inpatient Medications    Scheduled Meds: . aspirin  81 mg Oral Daily  . atorvastatin  80 mg Oral Daily  . atorvastatin  80 mg Oral Daily  . Chlorhexidine Gluconate Cloth  6 each Topical Daily  . clopidogrel  75 mg Oral Q breakfast  . gabapentin  300 mg Oral TID  . pantoprazole  40 mg Oral Q0600  . sodium chloride flush  3 mL Intravenous Q12H  . sodium chloride flush  3 mL Intravenous Q12H   Continuous Infusions: . sodium chloride 100 mL/hr at 04/25/20 0830  . sodium chloride     PRN Meds: sodium chloride, acetaminophen, acetaminophen, morphine injection, nitroGLYCERIN, ondansetron (ZOFRAN) IV, ondansetron (ZOFRAN) IV, sodium chloride flush   Vital Signs    Vitals:   04/26/20 0500 04/26/20 0600 04/26/20 0700 04/26/20 0713  BP: 100/65 115/71  (!) 116/91  Pulse: (!) 53 (!) 52 83 77  Resp: 11 13 15 16   Temp:      TempSrc:      SpO2: 95% 97% 95% 95%  Weight:      Height:        Intake/Output Summary (Last 24 hours) at 04/26/2020 0739 Last data filed at 04/26/2020 0700 Gross per 24 hour  Intake 1726.79 ml  Output 950 ml  Net 776.79 ml   Filed Weights   04/23/20 1348 04/24/20 0603 04/25/20 0618  Weight: 88.7 kg 87 kg 86.2 kg    Physical Exam   GEN: Well nourished, well developed, in no acute distress.  HEENT: Grossly normal.  Neck: Supple, no JVD, carotid bruits, or masses. Cardiac: RRR, no murmurs, rubs, or gallops. No clubbing, cyanosis, edema.  Right radial catheterization site without bleeding, bruit, or hematoma.  Radials/DP/PT 2+ and equal bilaterally.  Respiratory:  Respirations regular and unlabored, clear to auscultation bilaterally. GI: Soft, nontender,  nondistended, BS + x 4. MS: no deformity or atrophy. Skin: warm and dry, no rash. Neuro:  Strength and sensation are intact. Psych: AAOx3.  Normal affect.  Labs    Chemistry Recent Labs  Lab 04/23/20 0810 04/23/20 0810 04/23/20 1625 04/24/20 0532 04/26/20 0307  NA 140  --   --  136 136  K 3.9  --   --  4.0 3.7  CL 105  --   --  102 104  CO2 26  --   --  26 24  GLUCOSE 99  --   --  101* 92  BUN 22  --   --  15 16  CREATININE 0.91   < > 0.98 1.01 0.91  CALCIUM 9.2  --   --  9.5 9.0  GFRNONAA >60   < > >60 >60 >60  GFRAA >60   < > >60 >60 >60  ANIONGAP 9  --   --  8 8   < > = values in this interval not displayed.     Hematology Recent Labs  Lab 04/23/20 0810 04/23/20 1625 04/26/20 0307  WBC 6.2 5.8 6.5  RBC 4.79 4.81 4.66  HGB 14.9 15.0 14.7  HCT 44.2 44.9 43.3  MCV 92.3 93.3 92.9  MCH 31.1 31.2 31.5  MCHC 33.7 33.4 33.9  RDW 12.2  12.3 12.5  PLT 157 165 152    Cardiac Enzymes  Recent Labs  Lab 04/23/20 0810 04/23/20 1625 04/23/20 1717  TROPONINIHS 2 3 4       Lipids  Lab Results  Component Value Date   CHOL 242 (H) 04/24/2020   HDL 41 04/24/2020   LDLCALC 178 (H) 04/24/2020   TRIG 116 04/24/2020   CHOLHDL 5.9 04/24/2020   Radiology    DG Chest 2 View  Result Date: 04/23/2020 CLINICAL DATA:  Chest pain.  Elevated blood pressure. EXAM: CHEST - 2 VIEW COMPARISON:  03/01/2016 FINDINGS: Mild right hemidiaphragm elevation. Numerous leads and wires project over the chest. Midline trachea. Normal heart size and mediastinal contours. No pleural effusion or pneumothorax. Clear lungs. IMPRESSION: No acute cardiopulmonary disease. Electronically Signed   By: Abigail Miyamoto M.D.   On: 04/23/2020 09:07   Telemetry    Regular sinus rhythm, sinus bradycardia- Personally Reviewed  ECG    Sinus brady, 59, no acute ST/T changes - Personally Reviewed  Cardiac Studies   2D Echocardiogram 7.18.2021  1. Left ventricular ejection fraction, by estimation, is 65  to 70%. The  left ventricle has normal function. The left ventricle has no regional  wall motion abnormalities. Left ventricular diastolic parameters were  normal.  2. Right ventricular systolic function is normal. The right ventricular  size is normal.  3. The mitral valve is normal in structure. Trivial mitral valve  regurgitation.  4. The aortic valve is normal in structure. Aortic valve regurgitation is  trivial.  _____________   Coronary CT Angio w/ FFR 7.18.2021  1. Left Main: No significant stenosis. LM FFR = 0.99.  2. LAD: Possible significant stenosis Mid LAD. Proximal FFR = 0.98, Mid FFR = 0.72, Distal FFR = 0.61.  3. LCX: No significant stenosis. Proximal FFR = 0.99, Mid FFR = 0.91, Distal FFR = 0.86.  4. RCA: No significant stenosis. Proximal FFR = 0.98, Mid FFR = 0.93, Distal FFR = 0.91.  IMPRESSION: 1. CT Coronary Flow analysis demonstrates possible flow limiting lesion in the mid LAD.  2. Recommend Cardiac catheterization. _____________  Left Anterior Descending Prox LAD to Mid LAD lesion is 90% stenosed. Vessel is the culprit lesion. The lesion is calcified. Mid LAD lesion is 75% stenosed.  STENT RESOLUTE ONYX [id number]  _____________    Patient Profile     64 y.o.malewith lymphoma, chronic HA 2/2 CSF leak, GERD, white coat hypertension, untreated HL, FHx of CAD. He presented with ongoing chest pain >48 hours in duration. High sensitivity Troponin levels have been neg. Echo w/ nl EF. Cor CTA w/ mid/distal LAD dzs.  Now status post PCI drug-eluting stent placement to the proximal and mid LAD.  Assessment & Plan    1.  Unstable angina/coronary artery disease: Admitted July 17 with chest discomfort radiating to his left arm which started 2 nights prior to admission during sexual intercourse and associated with hypertension.  He had mild persistent discomfort all weekend long.  Coronary CT angiography performed July 18 notable for mid LAD stenosis  with FFR of 0.72.  Echo with normal LV function.  He underwent diagnostic catheterization July 19 revealing severe proximal and mid LAD disease (90% and 75% respectively).  Otherwise normal coronary arteries.  Now status post PCI and drug-eluting stent placement to the LAD.  He had significant chest pain throughout the procedure and has been monitored overnight and the coronary intensive care unit.  Chest and arm pain currently rated at 0.5/10.  Feels  washed out and tired after yesterday's procedure.  ECG nl.  Labs unremarkable.  Plan to ambulate today and reevaluate symptoms later with plan for possible discharge this afternoon though more likely tomorrow.  Continue aspirin, Plavix, and statin therapy.  Cardiac rehab to see.    2.  Hyperlipidemia: LDL 178.  Now on Lipitor 80 mg, which was started this admission.  Will need follow-up lipids and LFTs in 8 to 12 weeks.  3.  Chronic headache secondary to CSF leak: Stable.  On gabapentin.  Signed, Murray Hodgkins, NP  04/26/2020, 7:39 AM    For questions or updates, please contact   Please consult [email or web address] for contact info under Cardiology/STEMI.   The patient was seen, examined and discussed with Carmela Rima, NP and agree as above.  The patient underwent cardiac catheterization with peripheral correlation between coronary CTA, noninvasive CT FFR and cardiac catheterization with finding of severe stenosis in proximal to mid LAD and placement of drug-eluting stent.  The patient experienced significant pain during the procedure and has minimal discomfort this morning.  He is right radial access site looks good with no bleeding or bruising, good pulses.  The plan would be consult cardiac rehab to walk with the patient and if he feels better later today we can discharge him otherwise we will monitor him till morning.  We will continue current management with dual antiplatelet therapy aspirin and Plavix, high dose of atorvastatin 80 mg daily, his vitals  are stable.  He is EKG today shows sinus bradycardia 59 bpm, otherwise normal EKG.  His echocardiogram shows hyperdynamic LVEF, normal diastolic function no valvular abnormalities.  Ena Dawley, MD 04/26/2020

## 2020-04-26 NOTE — Plan of Care (Signed)
Pt is progressing well. Cardiac Rehab RN to see patient, pt may be D/c home today.

## 2020-04-26 NOTE — Progress Notes (Signed)
CARDIAC REHAB PHASE I   PRE:  Rate/Rhythm: 39 SR  BP:  Supine:   Sitting: 122/83  Standing:    SaO2: 96%RA  MODE:  Ambulation: 400 ft   POST:  Rate/Rhythm: 78 SR  BP:  Supine:   Sitting: 145/75  Standing:    SaO2: 99%RA 1048-1135 Pt stated chest pressure less than 1 prior to walk and gone after walk. Did state he felt a little SOB and tired after walking 400 ft. Tolerated well. Gait steady. Discussed NTG use, walking for ex, heart healthy food choices, importance of plavix with stent and CRP 2. Will refer to [place] CRP 2.  Pt and wife voiced understanding of ed.    Graylon Good, RN BSN  04/26/2020 11:30 AM

## 2020-04-26 NOTE — Progress Notes (Signed)
Pt given discharge instructions at this time. IVs removed. No further questions.

## 2020-04-28 ENCOUNTER — Telehealth (HOSPITAL_COMMUNITY): Payer: Self-pay

## 2020-04-28 NOTE — Telephone Encounter (Signed)
Faxed referral for Phase II cardiac rehab to [facility].

## 2020-04-29 ENCOUNTER — Encounter (HOSPITAL_COMMUNITY): Payer: Self-pay | Admitting: Pediatrics

## 2020-04-29 ENCOUNTER — Observation Stay (HOSPITAL_COMMUNITY)
Admission: EM | Admit: 2020-04-29 | Discharge: 2020-04-30 | Disposition: A | Discharge status: Home / Self Care | Payer: Managed Care, Other (non HMO) | Attending: Cardiology | Admitting: Cardiology

## 2020-04-29 ENCOUNTER — Other Ambulatory Visit: Payer: Self-pay

## 2020-04-29 ENCOUNTER — Emergency Department (HOSPITAL_COMMUNITY): Payer: Managed Care, Other (non HMO)

## 2020-04-29 ENCOUNTER — Telehealth: Payer: Self-pay | Admitting: Student

## 2020-04-29 DIAGNOSIS — Z20822 Contact with and (suspected) exposure to covid-19: Secondary | ICD-10-CM | POA: Diagnosis not present

## 2020-04-29 DIAGNOSIS — R079 Chest pain, unspecified: Secondary | ICD-10-CM | POA: Diagnosis not present

## 2020-04-29 DIAGNOSIS — Z79899 Other long term (current) drug therapy: Secondary | ICD-10-CM | POA: Insufficient documentation

## 2020-04-29 DIAGNOSIS — I2510 Atherosclerotic heart disease of native coronary artery without angina pectoris: Secondary | ICD-10-CM | POA: Diagnosis not present

## 2020-04-29 DIAGNOSIS — I10 Essential (primary) hypertension: Secondary | ICD-10-CM | POA: Insufficient documentation

## 2020-04-29 DIAGNOSIS — Z7901 Long term (current) use of anticoagulants: Secondary | ICD-10-CM | POA: Insufficient documentation

## 2020-04-29 DIAGNOSIS — Z7982 Long term (current) use of aspirin: Secondary | ICD-10-CM | POA: Insufficient documentation

## 2020-04-29 LAB — URINALYSIS, ROUTINE W REFLEX MICROSCOPIC
Bilirubin Urine: NEGATIVE
Glucose, UA: NEGATIVE mg/dL
Hgb urine dipstick: NEGATIVE
Ketones, ur: 20 mg/dL — AB
Leukocytes,Ua: NEGATIVE
Nitrite: NEGATIVE
Protein, ur: NEGATIVE mg/dL
Specific Gravity, Urine: 1.013 (ref 1.005–1.030)
pH: 5 (ref 5.0–8.0)

## 2020-04-29 LAB — BASIC METABOLIC PANEL
Anion gap: 11 (ref 5–15)
BUN: 15 mg/dL (ref 8–23)
CO2: 25 mmol/L (ref 22–32)
Calcium: 9.8 mg/dL (ref 8.9–10.3)
Chloride: 100 mmol/L (ref 98–111)
Creatinine, Ser: 1.06 mg/dL (ref 0.61–1.24)
GFR calc Af Amer: >60 mL/min (ref >60)
GFR calc non Af Amer: >60 mL/min (ref >60)
Glucose, Bld: 100 mg/dL — ABNORMAL HIGH (ref 70–99)
Potassium: 3.7 mmol/L (ref 3.5–5.1)
Sodium: 136 mmol/L (ref 135–145)

## 2020-04-29 LAB — CBC
HCT: 47.2 % (ref 39.0–52.0)
Hemoglobin: 15.7 g/dL (ref 13.0–17.0)
MCH: 30.8 pg (ref 26.0–34.0)
MCHC: 33.3 g/dL (ref 30.0–36.0)
MCV: 92.7 fL (ref 80.0–100.0)
Platelets: 173 10*3/uL (ref 150–400)
RBC: 5.09 MIL/uL (ref 4.22–5.81)
RDW: 12.2 % (ref 11.5–15.5)
WBC: 9.5 10*3/uL (ref 4.0–10.5)
nRBC: 0 % (ref 0.0–0.2)

## 2020-04-29 LAB — TROPONIN I (HIGH SENSITIVITY)
Troponin I (High Sensitivity): 14 ng/L (ref <18)
Troponin I (High Sensitivity): 14 ng/L (ref <18)

## 2020-04-29 MED ORDER — PANTOPRAZOLE SODIUM 40 MG PO TBEC
40.0000 mg | DELAYED_RELEASE_TABLET | Freq: Every day | ORAL | Status: DC
  Administered 2020-04-30: 40 mg via ORAL
  Filled 2020-04-29: qty 1

## 2020-04-29 MED ORDER — CLOPIDOGREL BISULFATE 75 MG PO TABS
75.0000 mg | ORAL_TABLET | Freq: Every day | ORAL | Status: DC
  Administered 2020-04-30: 75 mg via ORAL
  Filled 2020-04-29: qty 1

## 2020-04-29 MED ORDER — FENTANYL CITRATE (PF) 100 MCG/2ML IJ SOLN
50.0000 ug | Freq: Once | INTRAMUSCULAR | Status: AC
  Administered 2020-04-29: 50 ug via INTRAVENOUS
  Filled 2020-04-29 (×2): qty 2

## 2020-04-29 MED ORDER — ATORVASTATIN CALCIUM 80 MG PO TABS: 80.0000 mg | ORAL_TABLET | Freq: Every evening | ORAL | Status: DC

## 2020-04-29 MED ORDER — ASPIRIN 81 MG PO CHEW
81.0000 mg | CHEWABLE_TABLET | Freq: Every day | ORAL | Status: DC
  Administered 2020-04-30: 81 mg via ORAL
  Filled 2020-04-29: qty 1

## 2020-04-29 MED ORDER — SODIUM CHLORIDE 0.9% FLUSH: 3.0000 mL | Freq: Once | INTRAVENOUS | Status: DC

## 2020-04-29 MED ORDER — NITROGLYCERIN 0.4 MG SL SUBL: 0.4000 mg | SUBLINGUAL_TABLET | SUBLINGUAL | Status: DC | PRN

## 2020-04-29 MED ORDER — GABAPENTIN 300 MG PO CAPS
300.0000 mg | ORAL_CAPSULE | Freq: Three times a day (TID) | ORAL | Status: DC
  Administered 2020-04-30 (×2): 300 mg via ORAL
  Filled 2020-04-29 (×2): qty 1

## 2020-04-29 NOTE — ED Provider Notes (Addendum)
[facility] EMERGENCY DEPARTMENT Provider Note   CSN: [id number] Arrival date & time: 04/29/20  1830     History Chief Complaint  Patient presents with  . Chest Pain    Ronald Tapia is a 64 y.o. male presenting for evaluation of chest pain.  Patient states he was recently admitted and had a heart cath on the 19th, 4 days ago.  He had extreme pain during the cath and immediately afterward.  By the time he was discharged, his pain had improved, although not completely resolved.  He was doing well until today when he woke up feeling extremely fatigued.  He slept for most of the day, and then developed severe left-sided chest pain.  Pain is most in the lateral aspect of his chest.  It is constant, nothing makes it better or worse.  It is not worse with exertion or inspiration.  He continues to feel tired and nauseous.  He denies fever, headache, cough, shortness of breath, vomiting, abdominal pain, urinary symptoms, abnormal bowel movements.  He called his cardiologist, who recommended he come to the ER for further evaluation.  Reviewed recent hospitalization.  During cardiac cath, patient was found to have 90% stenosis of LAD.  Stent placed.  Reviewed notes in which patient had reported severe pain during and immediately following the catheterization. Additional history of CSF leak with chronic headaches, hyperlipidemia, depression, GERD, anxiety.  HPI     Past Medical History:  Diagnosis Date  . Anxiety   . CAD (coronary artery disease)    a. 04/2020 Cor CTA: mid LAD dzs (FFR 0.20m, 0.61d); b. 04/2020 PCI: LM nl, LAD 90p/m, 50m (2.75x72mm Resolute Onyx DES), LCX nl, RCA nl.  . Cancer ([place])   . 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 Depression   . GERD (gastroesophageal reflux disease) 04/24/2020  . Headache   . History of echocardiogram    a. 04/2020 Echo: EF 65-70%, no rwma, triv MR/AI.  Marland Kitchen Hyperlipidemia 04/24/2020    Patient  Active Problem List   Diagnosis Date Noted  . Coronary artery disease of native artery of native heart with stable angina pectoris ([place])   . Essential hypertension   . Chronic headaches 04/25/2020  . Chest pain of uncertain etiology 51/76/1607  . GERD (gastroesophageal reflux disease) 04/24/2020  . Hyperlipidemia 04/24/2020  . Midsternal chest pain 04/23/2020    Past Surgical History:  Procedure Laterality Date  . BACK SURGERY    . CORONARY STENT INTERVENTION N/A 04/25/2020   Procedure: CORONARY STENT INTERVENTION;  Surgeon: Lorretta Harp, MD;  Location: [facility] CV LAB;  Service: Cardiovascular;  Laterality: N/A;  . LEFT HEART CATH AND CORONARY ANGIOGRAPHY N/A 04/25/2020   Procedure: LEFT HEART CATH AND CORONARY ANGIOGRAPHY;  Surgeon: Lorretta Harp, MD;  Location: [facility] CV [facility];  Service: Cardiovascular;  Laterality: N/A;       Family History  Problem Relation Age of Onset  . Heart attack Father        died in his sleep, unclear if heart attack or stroke  . Hypertension Father   . Diabetes Mellitus II Father   . Breast cancer Sister   . Kidney cancer Brother   . Parkinson's disease Other   . Heart attack Maternal Grandfather        occurs in his 88s    Social History   Tobacco Use  . Smoking status: Never Smoker  . Smokeless tobacco:  Never Used  Vaping Use  . Vaping Use: Never used  Substance Use Topics  . Alcohol use: Not Currently  . Drug use: Never    Home Medications Prior to Admission medications   Medication Sig Start Date End Date Taking? Authorizing Provider  ascorbic acid (VITAMIN C) 500 MG tablet Take 500 mg by mouth daily.    [provider]  aspirin 81 MG chewable tablet Chew 1 tablet (81 mg total) by mouth daily. 04/27/20   Theora Gianotti, NP  atorvastatin (LIPITOR) 80 MG tablet Take 1 tablet (80 mg total) by mouth daily. 04/27/20   Theora Gianotti, NP  Cholecalciferol (VITAMIN D3 PO) Take 1 tablet by mouth  daily. 75000IU    [provider]  clopidogrel (PLAVIX) 75 MG tablet Take 1 tablet (75 mg total) by mouth daily with breakfast. 04/27/20   Theora Gianotti, NP  Digestive Enzymes (DIGESTIVE ENZYME PO) Take 1 tablet by mouth in the morning and at bedtime.    [provider]  gabapentin (NEURONTIN) 300 MG capsule Take 300 mg by mouth 3 (three) times daily.    [provider]  MAGNESIUM GLYCINATE PLUS PO Take 1 tablet by mouth daily. 400mg     [provider]  nitroGLYCERIN (NITROSTAT) 0.4 MG SL tablet Place 1 tablet (0.4 mg total) under the tongue every 5 (five) minutes x 3 doses as needed for chest pain. 04/26/20   Theora Gianotti, NP  pantoprazole (PROTONIX) 40 MG tablet Take 1 tablet (40 mg total) by mouth daily at 6 (six) AM. 04/27/20   Theora Gianotti, NP    Allergies    Amitriptyline, Keflex [cephalexin], and Topiramate  Review of Systems   Review of Systems  Cardiovascular: Positive for chest pain.  All other systems reviewed and are negative.   Physical Exam Updated Vital Signs BP (!) 135/86 (BP Location: Left Arm)   Pulse 85   Temp 98.2 F (36.8 C) (Oral)   Resp 16   Ht 5\' 9"  (1.753 m)   Wt 86.2 kg   SpO2 99%   BMI 28.06 kg/m   Physical Exam Vitals and nursing note reviewed.  Constitutional:      General: He is not in acute distress.    Appearance: He is well-developed.     Comments: Appears nontoxic  HENT:     Head: Normocephalic and atraumatic.  Eyes:     Conjunctiva/sclera: Conjunctivae normal.     Pupils: Pupils are equal, round, and reactive to light.  Cardiovascular:     Rate and Rhythm: Normal rate and regular rhythm.     Pulses: Normal pulses.  Pulmonary:     Effort: Pulmonary effort is normal. No respiratory distress.     Breath sounds: Normal breath sounds. No wheezing.  Abdominal:     General: There is no distension.     Palpations: Abdomen is soft. There is no mass.     Tenderness: There  is no abdominal tenderness. There is no guarding or rebound.  Musculoskeletal:        General: Normal range of motion.     Cervical back: Normal range of motion and neck supple.  Skin:    General: Skin is warm and dry.     Capillary Refill: Capillary refill takes less than 2 seconds.  Neurological:     Mental Status: He is alert and oriented to person, place, and time.     ED Results / Procedures / Treatments   Labs (all  labs ordered are listed, but only abnormal results are displayed) Labs Reviewed  BASIC METABOLIC PANEL - Abnormal; Notable for the following components:      Result Value   Glucose, Bld 100 (*)    All other components within normal limits  CBC  URINALYSIS, ROUTINE W REFLEX MICROSCOPIC  TROPONIN I (HIGH SENSITIVITY)  TROPONIN I (HIGH SENSITIVITY)    EKG None  Radiology DG Chest 2 View  Result Date: 04/29/2020 CLINICAL DATA:  Chest pain. EXAM: CHEST - 2 VIEW COMPARISON:  April 23, 2020 FINDINGS: There is no evidence of acute infiltrate, pleural effusion or pneumothorax. The heart size and mediastinal contours are within normal limits. The visualized skeletal structures are unremarkable. IMPRESSION: No active cardiopulmonary disease. Electronically Signed   By: Virgina Norfolk M.D.   On: 04/29/2020 19:11    Procedures Procedures (including critical care time)  Medications Ordered in ED Medications  sodium chloride flush (NS) 0.9 % injection 3 mL (has no administration in time range)    ED Course  I have reviewed the triage vital signs and the nursing notes.  Pertinent labs & imaging results that were available during my care of the patient were reviewed by me and considered in my medical decision making (see chart for details).    MDM Rules/Calculators/A&P                          Patient presenting for evaluation of chest pain.  This is concerning in the setting of a recent cath and stent placement.  On exam, patient appears nontoxic.  Labs obtained  from triage interpreted by me, overall reassuring.  Initial troponin 14, will obtain delta.  Chest x-ray viewed interpreted by me, no pneumonia pneumothorax, effusion, cardiomegaly.  However, due to his recent procedure, will consult with cardiology.  Discussed with Dr. Paticia Stack from cardiology, who will evaluate the patient in consultation.  On reassessment after medications, pt states his pain is improved.   Discussed with cardiology, who recommends overnight obvs.   Final Clinical Impression(s) / ED Diagnoses Final diagnoses:  None    Rx / DC Orders ED Discharge Orders    None       Franchot Heidelberg, PA-C 04/29/20 2320    Franchot Heidelberg, PA-C 04/29/20 2333    Quintella Reichert, MD 04/30/20 [phone]

## 2020-04-29 NOTE — Telephone Encounter (Signed)
   Received page about patient having severe chest pain following stent placement earlier this week. Reviewed patient's chart. He had a lot of pain during and after PCI and ultimately to transferred to coronary ICU. Chest pain slowly improved. He states he has had mild chest pain since discharge but states that it got significantly worse today. He has taken 3 doses of sublingual Nitro with no improvement. Pain currently 6/10. He states it is on the left side and different than the pain he had prior to PCI. Non-exertional. Explain that unfortunately I cannot tell him 100% that there is no problem over the phone. Given worsening chest pain and no improvement with Nitro, recommended proceeding to the ED for further evaluation. Patient voiced understanding and agreed. He will have wife drive him.  Darreld Mclean, PA-C 04/29/2020 6:06 PM

## 2020-04-29 NOTE — ED Notes (Signed)
Pts wife stated that he feels nauseous due to not eating since he has been here. This NT informed triage RN Anderson Malta, RN informed this NT the pt would be moved to the next room.

## 2020-04-29 NOTE — H&P (Signed)
Cardiology Admission History and Physical:   Patient ID: Ronald Tapia; MRN: [id number]; DOB: 05-25-1956   Admission date: 04/29/2020  Primary Care Provider:  Osie Cheeks, PA-C Primary Cardiologist:  Fransico Him, MD    Chief Complaint:    Chest pain  History of Present Illness:   Ronald Tapia is a 64 y.o. male with a history of CAD (s/p PCI of the LAD on 04/25/2020), lymphoma, chronic headaches, prior CSF leak, GERD, hyperlipidemia, anxiety, and depression, who presented to the hospital with complaints of left sided chest pain. He was recently evaluated in the hospital for chest pain. A coronary CT (with FFR) showed a probable stenosis in the LAD. This was confirmed on a cardiac cath. He then underwent PCI of the prox to mid LAD on 04/25/2020. He had chest pain during and after the PCI. After being observed in the hospital overnight he was discharged home.   The patient has continued to have chest pain at home. It is not responsive to NTG. It is a severe pain, constant in nature and associated with some generalized fatigue. He denies any shortness of breath or pleuritic pain. He now returns to the hospital for further evaluation.  In the ED the vital signs were: BP 142/91 mmHg, heart rate 92 bpm. The 12 lead ECG was normal. The hs-troponins were 14 and 14. He eventually had some relief in his chest pain when given fentanyl.  Recent Cardiac Work-up Cardiac catheterization 04/25/2020  Prox LAD to Mid LAD lesion is 90% stenosed.  Mid LAD lesion is 75% stenosed.  A drug-eluting stent was successfully placed using a [place] [id number].  Post intervention, there is a 0% residual stenosis  Coronary CT Angio w/ FFR 04/24/2020 1. Left Main: No significant stenosis. LM FFR = 0.99. 2. LAD: Possible significant stenosis Mid LAD. Proximal FFR = 0.98, Mid FFR = 0.72, Distal FFR = 0.61. 3. LCX: No significant stenosis. Proximal FFR = 0.99, Mid FFR = 0.91, Distal FFR =  0.86. 4. RCA: No significant stenosis. Proximal FFR = 0.98, Mid FFR = 0.93, Distal FFR = 0.91. IMPRESSION: 1. CT Coronary Flow analysis demonstrates possible flow limiting lesion in the mid LAD.  Echocardiogram 04/24/2020 1. Left ventricular ejection fraction, by estimation, is 65 to 70%. The  left ventricle has normal function. The left ventricle has no regional  wall motion abnormalities. Left ventricular diastolic parameters were  normal.  2. Right ventricular systolic function is normal. The right ventricular  size is normal.  3. The mitral valve is normal in structure. Trivial mitral valve  regurgitation.  4. The aortic valve is normal in structure. Aortic valve regurgitation is  trivial.   Past Medical History:  Diagnosis Date  . Anxiety   . CAD (coronary artery disease)    a. 04/2020 Cor CTA: mid LAD dzs (FFR 0.61m, 0.61d); b. 04/2020 PCI: LM nl, LAD 90p/m, 61m (2.75x73mm Resolute Onyx DES), LCX nl, RCA nl.  . Cancer ([place])   . 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 Depression   . GERD (gastroesophageal reflux disease) 04/24/2020  . Headache   . History of echocardiogram    a. 04/2020 Echo: EF 65-70%, no rwma, triv MR/AI.  Marland Kitchen Hyperlipidemia 04/24/2020    Past Surgical History:  Procedure Laterality Date  . BACK SURGERY    . CORONARY STENT INTERVENTION N/A 04/25/2020   Procedure: CORONARY STENT INTERVENTION;  Surgeon: Lorretta Harp, MD;  Location: [facility]  INVASIVE CV LAB;  Service: Cardiovascular;  Laterality: N/A;  . LEFT HEART CATH AND CORONARY ANGIOGRAPHY N/A 04/25/2020   Procedure: LEFT HEART CATH AND CORONARY ANGIOGRAPHY;  Surgeon: Lorretta Harp, MD;  Location: [facility] CV LAB;  Service: Cardiovascular;  Laterality: N/A;     Medications Prior to Admission: Prior to Admission medications   Medication Sig Start Date End Date Taking? Authorizing Provider  ascorbic acid (VITAMIN C) 500 MG tablet Take 500 mg by mouth daily.   Yes  [provider]  aspirin 81 MG chewable tablet Chew 1 tablet (81 mg total) by mouth daily. 04/27/20  Yes Theora Gianotti, NP  atorvastatin (LIPITOR) 80 MG tablet Take 1 tablet (80 mg total) by mouth daily. Patient taking differently: Take 80 mg by mouth every evening.  04/27/20  Yes Theora Gianotti, NP  Cholecalciferol (VITAMIN D3 PO) Take 1 tablet by mouth daily. 75000IU   Yes [provider]  clopidogrel (PLAVIX) 75 MG tablet Take 1 tablet (75 mg total) by mouth daily with breakfast. 04/27/20  Yes Theora Gianotti, NP  Digestive Enzymes (DIGESTIVE ENZYME PO) Take 1 tablet by mouth in the morning and at bedtime.   Yes [provider]  gabapentin (NEURONTIN) 300 MG capsule Take 300 mg by mouth 3 (three) times daily.   Yes [provider]  MAGNESIUM GLYCINATE PLUS PO Take 1 tablet by mouth daily. 400mg    Yes [provider]  nitroGLYCERIN (NITROSTAT) 0.4 MG SL tablet Place 1 tablet (0.4 mg total) under the tongue every 5 (five) minutes x 3 doses as needed for chest pain. 04/26/20  Yes Theora Gianotti, NP  pantoprazole (PROTONIX) 40 MG tablet Take 1 tablet (40 mg total) by mouth daily at 6 (six) AM. 04/27/20  Yes Theora Gianotti, NP     Allergies:    Allergies  Allergen Reactions  . Amitriptyline     Made me feel strange  . Keflex [Cephalexin]     Stomach pain  . Topiramate     Made me feel strange    Social History:   Social History   Socioeconomic History  . Marital status: Married    Spouse name: Not on file  . Number of children: Not on file  . Years of education: Not on file  . Highest education level: Not on file  Occupational History  . Not on file  Tobacco Use  . Smoking status: Never Smoker  . Smokeless tobacco: Never Used  Vaping Use  . Vaping Use: Never used  Substance and Sexual Activity  . Alcohol use: Not Currently  . Drug use: Never  . Sexual activity: Yes  Other Topics  Concern  . Not on file  Social History Narrative  . Not on file   Social Determinants of Health   Financial Resource Strain:   . Difficulty of Paying Living Expenses:   Food Insecurity:   . Worried About Charity fundraiser in the Last Year:   . Arboriculturist in the Last Year:   Transportation Needs:   . Film/video editor (Medical):   Marland Kitchen Lack of Transportation (Non-Medical):   Physical Activity:   . Days of Exercise per Week:   . Minutes of Exercise per Session:   Stress:   . Feeling of Stress :   Social Connections:   . Frequency of Communication with Friends and Family:   . Frequency of Social Gatherings with Friends and Family:   . Attends  Religious Services:   . Active Member of Clubs or Organizations:   . Attends Archivist Meetings:   Marland Kitchen Marital Status:   Intimate Partner Violence:   . Fear of Current or Ex-Partner:   . Emotionally Abused:   Marland Kitchen Physically Abused:   . Sexually Abused:      Family History:   The patient's family history includes Breast cancer in his sister; Diabetes Mellitus II in his father; Heart attack in his father and maternal grandfather; Hypertension in his father; Kidney cancer in his brother; Parkinson's disease in an other family member.     Review of Systems: [y] = yes, [ ]  = no   . General: Weight gain [ ] ; Weight loss [ ] ; Anorexia [ ] ; Fatigue [ ] ; Fever [ ] ; Chills [ ] ; Weakness [Y]  . Cardiac: Chest pain/pressure [Y]; Resting SOB [ ] ; Exertional SOB [ ] ; Orthopnea [ ] ; Pedal Edema [ ] ; Palpitations [ ] ; Syncope [ ] ; Presyncope [ ] ; Paroxysmal nocturnal dyspnea[ ]   . Pulmonary: Cough [ ] ; Wheezing[ ] ; Hemoptysis[ ] ; Sputum [ ] ; Snoring [ ]   . GI: Vomiting[ ] ; Dysphagia[ ] ; Melena[ ] ; Hematochezia [ ] ; Heartburn[ ] ; Abdominal pain [ ] ; Constipation [ ] ; Diarrhea [ ] ; BRBPR [ ]   . GU: Hematuria[ ] ; Dysuria [ ] ; Nocturia[ ]   . Vascular: Pain in legs with walking [ ] ; Pain in feet with lying flat [ ] ; Non-healing sores [ ] ;  Stroke [ ] ; TIA [ ] ; Slurred speech [ ] ;  . Neuro: Headaches[ ] ; Vertigo[ ] ; Seizures[ ] ; Paresthesias[ ] ;Blurred vision [ ] ; Diplopia [ ] ; Vision changes [ ]   . Ortho/Skin: Arthritis [ ] ; Joint pain [ ] ; Muscle pain [ ] ; Joint swelling [ ] ; Back Pain [ ] ; Rash [ ]   . Psych: Depression[ ] ; Anxiety[ ]   . Heme: Bleeding problems [ ] ; Clotting disorders [ ] ; Anemia [ ]   . Endocrine: Diabetes [ ] ; Thyroid dysfunction[ ]      Physical Exam/Data:   Vitals:   04/29/20 1841 04/29/20 1954  BP: (!) 142/91 (!) 135/86  Pulse: 92 85  Resp: 20 16  Temp: 98.2 F (36.8 C)   TempSrc: Oral   SpO2: 98% 99%  Weight: 86.2 kg   Height: 5\' 9"  (1.753 m)    No intake or output data in the 24 hours ending 04/29/20 2250 Filed Weights   04/29/20 1841  Weight: 86.2 kg   Body mass index is 28.06 kg/m.  General:  Well nourished, well developed, in no acute distress HEENT: normal Lymph: no adenopathy Neck: no JVD Endocrine:  No thryomegaly Vascular: No carotid bruits; FA pulses 2+ bilaterally without bruits  Cardiac:  normal S1, S2; RRR; no murmur  Lungs:  clear to auscultation bilaterally, no wheezing, rhonchi or rales  Abd: soft, nontender, no hepatomegaly  Ext: no edema Musculoskeletal:  No deformities, BUE and BLE strength normal and equal Skin: warm and dry  Neuro:  CNs 2-12 intact, no focal abnormalities noted Psych:  Normal affect    Laboratory Data:  Chemistry Recent Labs  Lab 04/26/20 0307 04/29/20 1855  NA 136 136  K 3.7 3.7  CL 104 100  CO2 24 25  GLUCOSE 92 100*  BUN 16 15  CREATININE 0.91 1.06  CALCIUM 9.0 9.8  GFRNONAA >60 >60  GFRAA >60 >60  ANIONGAP 8 11    No results for input(s): PROT, ALBUMIN, AST, ALT, ALKPHOS, BILITOT in the last 168 hours. Hematology Recent Labs  Lab  04/26/20 0307 04/29/20 1855  WBC 6.5 9.5  RBC 4.66 5.09  HGB 14.7 15.7  HCT 43.3 47.2  MCV 92.9 92.7  MCH 31.5 30.8  MCHC 33.9 33.3  RDW 12.5 12.2  PLT 152 173   Cardiac EnzymesNo  results for input(s): TROPONINI in the last 168 hours. No results for input(s): TROPIPOC in the last 168 hours.  BNPNo results for input(s): BNP, PROBNP in the last 168 hours.  DDimer No results for input(s): DDIMER in the last 168 hours.  Radiology/Studies:  DG Chest 2 View  Result Date: 04/29/2020 CLINICAL DATA:  Chest pain. EXAM: CHEST - 2 VIEW COMPARISON:  April 23, 2020 FINDINGS: There is no evidence of acute infiltrate, pleural effusion or pneumothorax. The heart size and mediastinal contours are within normal limits. The visualized skeletal structures are unremarkable. IMPRESSION: No active cardiopulmonary disease. Electronically Signed   By: Virgina Norfolk M.D.   On: 04/29/2020 19:11    Assessment and Plan:   1. Chest pain The patient recently had a Coronary CT angio w/FFR on 04/24/2020 that showed a possible flow-limiting stenosis in the mid LAD. He then underwent a diagnostic cath (on 04/25/2020) that confirmed the stenosis and he had a PCI of the prox to mid LAD with a long drug-eluting stenting. He reported chest pain during the PCI and afterwards. Since going home he has had continued symptoms. The pain is left sided and does not improve with NTG. Today he called the heart team and was encouraged to come to the ED for an evaluation.  12 lead ECG does not show any ischemic changes. The first hs-Troponin is 14. For ongoing symptoms we will observe the patient for the next few hours.  - Admit for observation - Continue ASA 81 mg daily - Continue Plavix 75 mg daily - Continue atorvastatin 80 mg nightly - Trend cardiac biomarkers - Serial ECGs  The patient is concerned about the effectiveness of the Plavix. We can certainly consider a P2Y12 assay (either inpatient or outpatient) to determine the degree of platelet inhibition on clopidogrel.   Severity of Illness: The appropriate patient status for this patient is OBSERVATION. Observation status is judged to be reasonable and  necessary in order to provide the required intensity of service to ensure the patient's safety. The patient's presenting symptoms, physical exam findings, and initial radiographic and laboratory data in the context of their medical condition is felt to place them at decreased risk for further clinical deterioration. Furthermore, it is anticipated that the patient will be medically stable for discharge from the hospital within 2 midnights of admission. The following factors support the patient status of observation.   " The patient's presenting symptoms include ongoing chest pain. " The physical exam findings include NA. " The initial radiographic and laboratory data are NA.     For questions or updates, please contact [place] Please consult [email or web address] for contact info under Cardiology/STEMI.    Signed, Meade Maw, MD  04/29/2020 10:50 PM

## 2020-04-29 NOTE — ED Triage Notes (Signed)
Pt c/o left sided chest pain and endorsed hx of stent placement on 04/25/2020. Stated when placing stent it was severely traumatic and painful. Pt reported CP is unresolved by NTG x 3.

## 2020-04-30 DIAGNOSIS — R079 Chest pain, unspecified: Secondary | ICD-10-CM | POA: Diagnosis not present

## 2020-04-30 LAB — CREATININE, SERUM
Creatinine, Ser: 0.93 mg/dL (ref 0.61–1.24)
GFR calc Af Amer: >60 mL/min (ref >60)
GFR calc non Af Amer: >60 mL/min (ref >60)

## 2020-04-30 LAB — CBC
HCT: 42.4 % (ref 39.0–52.0)
Hemoglobin: 14.9 g/dL (ref 13.0–17.0)
MCH: 32.5 pg (ref 26.0–34.0)
MCHC: 35.1 g/dL (ref 30.0–36.0)
MCV: 92.4 fL (ref 80.0–100.0)
Platelets: 143 10*3/uL — ABNORMAL LOW (ref 150–400)
RBC: 4.59 MIL/uL (ref 4.22–5.81)
RDW: 12.5 % (ref 11.5–15.5)
WBC: 7.8 10*3/uL (ref 4.0–10.5)
nRBC: 0 % (ref 0.0–0.2)

## 2020-04-30 LAB — SARS CORONAVIRUS 2 BY RT PCR (HOSPITAL ORDER, PERFORMED IN ~~LOC~~ HOSPITAL LAB): SARS Coronavirus 2: NEGATIVE

## 2020-04-30 LAB — SEDIMENTATION RATE: Sed Rate: 15 mm/hr (ref 0–16)

## 2020-04-30 MED ORDER — ISOSORBIDE MONONITRATE ER 30 MG PO TB24
30.0000 mg | ORAL_TABLET | Freq: Every day | ORAL | Status: DC
  Administered 2020-04-30: 30 mg via ORAL
  Filled 2020-04-30: qty 1

## 2020-04-30 MED ORDER — DICLOFENAC SODIUM 1 % EX GEL
2.0000 g | Freq: Four times a day (QID) | CUTANEOUS | Status: DC
  Administered 2020-04-30: 2 g via TOPICAL
  Filled 2020-04-30: qty 100

## 2020-04-30 MED ORDER — DICLOFENAC SODIUM 1 % EX GEL: 2.0000 g | Freq: Four times a day (QID) | CUTANEOUS | 0 refills | Status: DC

## 2020-04-30 MED ORDER — ENOXAPARIN SODIUM 40 MG/0.4ML ~~LOC~~ SOLN
40.0000 mg | Freq: Every day | SUBCUTANEOUS | Status: DC
  Administered 2020-04-30: 40 mg via SUBCUTANEOUS
  Filled 2020-04-30: qty 0.4

## 2020-04-30 MED ORDER — ISOSORBIDE MONONITRATE ER 30 MG PO TB24: 30.0000 mg | ORAL_TABLET | Freq: Every day | ORAL | 1 refills | Status: DC

## 2020-04-30 MED ORDER — ACETAMINOPHEN 325 MG PO TABS: 650.0000 mg | ORAL_TABLET | ORAL | Status: DC | PRN

## 2020-04-30 MED ORDER — ONDANSETRON HCL 4 MG/2ML IJ SOLN: 4.0000 mg | Freq: Four times a day (QID) | INTRAMUSCULAR | Status: DC | PRN

## 2020-04-30 NOTE — Progress Notes (Addendum)
Progress Note  Patient Name: Ronald Tapia Date of Encounter: 04/30/2020  Primary Cardiologist:  Fransico Him, MD  Subjective   Still with 2/10 chest pain, slight tenderness just lateral to L breast, no change w/ deep inspiration  Inpatient Medications    Scheduled Meds: . aspirin  81 mg Oral Daily  . atorvastatin  80 mg Oral QPM  . clopidogrel  75 mg Oral Q breakfast  . enoxaparin (LOVENOX) injection  40 mg Subcutaneous QHS  . gabapentin  300 mg Oral TID  . pantoprazole  40 mg Oral Q0600  . sodium chloride flush  3 mL Intravenous Once   Continuous Infusions:  PRN Meds: acetaminophen, nitroGLYCERIN, ondansetron (ZOFRAN) IV   Vital Signs    Vitals:   04/30/20 0030 04/30/20 0045 04/30/20 0215 04/30/20 0715  BP: 105/66 114/83 (!) 112/59 (!) 132/83  Pulse: 71 85 85 72  Resp: _0 Temp:      TempSrc:      SpO2: 94% 93% 94% 95%  Weight:      Height:       No intake or output data in the 24 hours ending 04/30/20 0740 Filed Weights   04/29/20 1841 04/29/20 2311  Weight: 86.2 kg 86.2 kg   Last Weight  Most recent update: 04/29/2020 11:11 PM   Weight  86.2 kg (190 lb 0.6 oz)           Weight change:    Telemetry    07/24 ECG is SR, HR 74, no acute ischemic changes - Personally Reviewed  ECG    SR - Personally Reviewed  Physical Exam   General: Well developed, well nourished, male appearing in no acute distress. Head: Normocephalic, atraumatic.  Neck: Supple without bruits, JVD not elevated. Lungs:  Resp regular and unlabored, CTA. Heart: RRR, S1, S2, no S3, S4, or murmur; no rub. Abdomen: Soft, non-tender, non-distended with normoactive bowel sounds. No hepatomegaly. No rebound/guarding. No obvious abdominal masses. Extremities: No clubbing, cyanosis, no edema. Distal pedal pulses are 2+ bilaterally. Neuro: Alert and oriented X 3. Moves all extremities spontaneously. Psych: Normal affect.  Labs    Hematology Recent Labs  Lab  04/26/20 0307 04/29/20 1855 04/30/20 0437  WBC 6.5 9.5 7.8  RBC 4.66 5.09 4.59  HGB 14.7 15.7 14.9  HCT 43.3 47.2 42.4  MCV 92.9 92.7 92.4  MCH 31.5 30.8 32.5  MCHC 33.9 33.3 35.1  RDW 12.5 12.2 12.5  PLT 152 173 143*    Chemistry Recent Labs  Lab 04/24/20 0532 04/24/20 0532 04/26/20 0307 04/29/20 1855 04/30/20 0437  NA 136  --  136 136  --   K 4.0  --  3.7 3.7  --   CL 102  --  104 100  --   CO2 26  --  24 25  --   GLUCOSE 101*  --  92 100*  --   BUN 15  --  16 15  --   CREATININE 1.01   < > 0.91 1.06 0.93  CALCIUM 9.5  --  9.0 9.8  --   GFRNONAA >60   < > >60 >60 >60  GFRAA >60   < > >60 >60 >60  ANIONGAP 8  --  8 11  --    < > = values in this interval not displayed.     High Sensitivity Troponin:   Recent Labs  Lab 04/23/20 0810 04/23/20 1625 04/23/20 1717 04/29/20 1855 04/29/20 2046  TROPONINIHS 2 3  _0 BNPNo results for input(s): BNP, PROBNP in the last 168 hours.   DDimer No results for input(s): DDIMER in the last 168 hours.   Radiology    DG Chest 2 View  Result Date: 04/29/2020 CLINICAL DATA:  Chest pain. EXAM: CHEST - 2 VIEW COMPARISON:  April 23, 2020 FINDINGS: There is no evidence of acute infiltrate, pleural effusion or pneumothorax. The heart size and mediastinal contours are within normal limits. The visualized skeletal structures are unremarkable. IMPRESSION: No active cardiopulmonary disease. Electronically Signed   By: Virgina Norfolk M.D.   On: 04/29/2020 19:11     Cardiac Studies   None this admit  Previous studies:  2D Echocardiogram 7.18.2021  1. Left ventricular ejection fraction, by estimation, is 65 to 70%. The  left ventricle has normal function. The left ventricle has no regional  wall motion abnormalities. Left ventricular diastolic parameters were  normal.  2. Right ventricular systolic function is normal. The right ventricular  size is normal.  3. The mitral valve is normal in structure. Trivial  mitral valve  regurgitation.  4. The aortic valve is normal in structure. Aortic valve regurgitation is  trivial.  _____________  Coronary CT Angio w/ FFR 7.18.2021  1. Left Main: No significant stenosis. LM FFR = 0.99. 2. LAD: Possible significant stenosis Mid LAD. Proximal FFR = 0.98, Mid FFR = 0.72, Distal FFR = 0.61. 3. LCX: No significant stenosis. Proximal FFR = 0.99, Mid FFR = 0.91, Distal FFR = 0.86. 4. RCA: No significant stenosis. Proximal FFR = 0.98, Mid FFR = 0.93, Distal FFR = 0.91.  IMPRESSION: 1. CT Coronary Flow analysis demonstrates possible flow limiting lesion in the mid LAD. 2. Recommend Cardiac catheterization. _____________    Cardiac Catheterization 7.19.2021 Left main normal Left Anterior Descending Prox LAD to Mid LAD lesion is 90% stenosed. Vessel is the culprit lesion. The lesion is calcified. Mid LAD lesion is 75% stenosed.            **The LAD was successfully treated with a 2.75 x 38 mm resolute Onyx drug-eluting stent** Left circumflex normal Right coronary artery normal  Patient Profile     64 y.o. male w/ hx CAD (s/p PCI of the LAD on 04/25/2020), lymphoma, chronic headaches secondary to CSF leak, GERD, whitecoat hypertension, untreated hyperlipidemia, family history of coronary artery disease, was d/c'd after PCI on 07/19. He continued to have CP, no change w/ nitro>>came back to the hospital 07/23.  Assessment & Plan    1. Chest pain: - explained that stent is open since ECG and troponin not acute - However, will ck P2Y12 (a send out to [facility] and will take a long time) to make sure - explained that chest wall pain is possible since pain is localized and ?chest wall tenderness, sx improved w/ pain rx>>will try Voltaren gel. - ck ESR - MD advise on trying NSAIDS - explained that he could have small vessel dz>>will try Imdur  Pt is otherwise stable.   Plan: possible d/c later today if sx improved.   Principal Problem:   Chest pain  of uncertain etiology Active Problems:   Chest pain    Jonetta Speak , PA-C 7:40 AM 04/30/2020 Pager: [phone]  Patient seen and examined with Rosaria Ferries, PA-C.  Agree as above, with the following exceptions and changes as noted below. His chest pain has improved. It seems his chest pain has improved with imdur and topical therapy. Gen: NAD,  CV: RRR, no murmurs, Lungs: clear, Abd: soft, Extrem: Warm, well perfused, no edema, Neuro/Psych: alert and oriented x 3, normal mood and affect. All available labs, radiology testing, previous records reviewed. We can titrate Imdur as an outpatient for possible angina vs spasm, chest pain has had similar character but varying intensity and location. I offered echocardiogram to rule out pericardial effusion, however we participated in shared decision making and he deferred for now. Appropriate for discharge from ER at this time.   Elouise Munroe, MD 04/30/20 2:48 PM

## 2020-04-30 NOTE — ED Notes (Signed)
Lunch Tray Ordered @ 1044.  

## 2020-04-30 NOTE — ED Notes (Signed)
Breakfast ordered by Ronald Tapia 

## 2020-04-30 NOTE — Discharge Summary (Signed)
Discharge Summary    Patient ID: Ronald Tapia MRN: [id number]; DOB: 04/18/1956  Admit date: 04/29/2020 Discharge date: 04/30/2020  Primary Care Provider: Osie Cheeks, PA-C  Primary Cardiologist: Fransico Him, MD  Primary Electrophysiologist:  None   Discharge Diagnoses    Principal Problem:   Chest pain of uncertain etiology Active Problems:   Chest pain   Allergies Allergies  Allergen Reactions  . Amitriptyline     Made me feel strange  . Keflex [Cephalexin]     Stomach pain  . Topiramate     Made me feel strange    Diagnostic Studies/Procedures    No cardiac testing this admission, radiology results are below _____________   History of Present Illness     Ronald Tapia is a 64 y.o. 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 He continued to have CP, no change w/ nitro>>came back to the hospital 07/23.  Hospital Course     Consultants: None   Mr. Snelson has had continuous chest pain since his procedure.  It started during the procedure.  It is not helped very much by nitroglycerin.  It does not change with deep inspiration.  There is an area just above and lateral to his left breast which is slightly tender.  That is the location of the pain.  Overnight, his symptoms improved.  He was continued on his home gabapentin 300 mg 3 times daily.  He was started on Imdur and Voltaren gel to manage the symptoms and for possible small vessel disease.  On 7/24, he was seen by Dr. Margaretann Loveless and all data were reviewed.  On current medications, he is resting comfortably.  A sed rate was performed which was within normal limits.  The patient and his wife were conce keenly changes, rned about the possibility of him being a Plavix nonresponder.  It was explained to them that because  he has not had an MI and his ECG is without acute ischemic changes, stent is open.  A P2Y12 was sent for confirmation, and is pending at the time of discharge..  By 7/24 PM, his symptoms had improved to the point that he is considered stable for discharge, to follow-up as an outpatient.   Did the patient have an acute coronary syndrome (MI, NSTEMI, STEMI, etc) this admission?:  No                               Did the patient have a percutaneous coronary intervention (stent / angioplasty)?:  No.   _____________  Discharge Vitals Blood pressure 119/77, pulse 78, temperature 98.2 F (36.8 C), temperature source Oral, resp. rate 14, height '5\' 9"'  (1.753 m), weight 86.2 kg, SpO2 95 %.  Filed Weights   04/29/20 1841 04/29/20 2311  Weight: 86.2 kg 86.2 kg    Labs & Radiologic Studies    CBC Recent Labs    04/29/20 1855 04/30/20 0437  WBC 9.5 7.8  HGB 15.7 14.9  HCT 47.2 42.4  MCV 92.7 92.4  PLT 173 353*   Basic Metabolic Panel Recent Labs    04/29/20 1855 04/30/20 0437  NA 136  --   K 3.7  --   CL 100  --   CO2 25  --   GLUCOSE 100*  --  BUN 15  --   CREATININE 1.06 0.93  CALCIUM 9.8  --    Liver Function Tests No results for input(s): AST, ALT, ALKPHOS, BILITOT, PROT, ALBUMIN in the last 72 hours. No results for input(s): LIPASE, AMYLASE in the last 72 hours. High Sensitivity Troponin:   Recent Labs  Lab 04/23/20 0810 04/23/20 1625 04/23/20 1717 04/29/20 1855 04/29/20 2046  TROPONINIHS '2 3 4 14 14    ' BNP Invalid input(s): POCBNP D-Dimer No results for input(s): DDIMER in the last 72 hours. Hemoglobin A1C No results for input(s): HGBA1C in the last 72 hours. Fasting Lipid Panel No results for input(s): CHOL, HDL, LDLCALC, TRIG, CHOLHDL, LDLDIRECT in the last 72 hours. Thyroid Function Tests No results for input(s): TSH, T4TOTAL, T3FREE, THYROIDAB in the last 72 hours.  Invalid input(s): FREET3 _____________  DG Chest 2 View  Result Date:  04/29/2020 CLINICAL DATA:  Chest pain. EXAM: CHEST - 2 VIEW COMPARISON:  April 23, 2020 FINDINGS: There is no evidence of acute infiltrate, pleural effusion or pneumothorax. The heart size and mediastinal contours are within normal limits. The visualized skeletal structures are unremarkable. IMPRESSION: No active cardiopulmonary disease. Electronically Signed   By: Virgina Norfolk M.D.   On: 04/29/2020 19:11   DG Chest 2 View  Result Date: 04/23/2020 CLINICAL DATA:  Chest pain.  Elevated blood pressure. EXAM: CHEST - 2 VIEW COMPARISON:  03/01/2016 FINDINGS: Mild right hemidiaphragm elevation. Numerous leads and wires project over the chest. Midline trachea. Normal heart size and mediastinal contours. No pleural effusion or pneumothorax. Clear lungs. IMPRESSION: No acute cardiopulmonary disease. Electronically Signed   By: Abigail Miyamoto M.D.   On: 04/23/2020 09:07   CARDIAC CATHETERIZATION  Result Date: 04/25/2020  Prox LAD to Mid LAD lesion is 90% stenosed.  Mid LAD lesion is 75% stenosed.  A drug-eluting stent was successfully placed using a [place] [id number].  Post intervention, there is a 0% residual stenosis.  Post intervention, there is a 0% residual stenosis.  Ronald Tapia is a 64 y.o. male  [id number] LOCATION:  FACILITY: [facility] PHYSICIAN: Quay Burow, M.D. [birth year] DATE OF PROCEDURE:  04/25/2020 DATE OF DISCHARGE: CARDIAC CATHETERIZATION / PCI DES LAD History obtained from chart review.64 y.o.malewith lymphoma, chronic HA 2/2 CSF leak, GERD, white coat hypertension, untreated HL, FHx of CAD. He presented with ongoing chest pain >48 hours in duration. High sensitivity Troponin levels have been neg. Echo w/ nl EF. Cor CTA w/ mid/distal LAD dzs. PROCEDURE DESCRIPTION: The patient was brought to the second floor [facility] cath lab in the postabsorptive state. He was premedicated with IV Versed and fentanyl. His right wrist was prepped and shaved in usual sterile fashion.  Xylocaine 1% was used for local anesthesia. A 6 French sheath was inserted into the right radial artery using standard Seldinger technique. The patient received 4500 units  of heparin intravenously.  A 5 [place] TIG catheter was used for selective coronary angiography and obtain left heart pressures.  Isovue dye was used for the entirety of the case.  Retroaortic, ventricular and pullback pressures were recorded.  Radial cocktail was administered through the SideArm sheath. The patient received an additional 6000 as of heparin with an ACT of greater than 300.  A total of 225 cc of contrast was administered to the patient.  He received 600 mg of p.o. Plavix followed by Pepcid 20 mg IV.  Isovue dye was used for the entirety of the case.  Retroaortic pressures monitored in the case.  Using a 6 [place] XB LAD 3.5 cm guide catheter along with two 0.14 [place] guidewires and a 2 mm x 15 mm long balloon the LAD and diagonal branch were both wired and the LAD was predilated in the proximal and midportion.  Following this a 2.75 x 38 mm long resolute Onyx drug-eluting stent was then carefully positioned across the diagonal branch encompassing the mid LAD stenosis and deployed at 14 atm (2.8 mm.  The proximal LAD was then postdilated with a 3.25 x 12 mm long noncompliant balloon at 14 to 16 atm (3.3 mm.  The diagonal branch wire was then withdrawn completion angiography showed mild narrowing of the ostium of the diagonal branch but it was still widely patent with TIMI-3 flow and excellent apposition of the stent in the proximal and mid LAD.  The patient did have some chest pain after the procedure which was responded to fentanyl and IV nitroglycerin.  His elevated blood pressure was treated with 10 mg of IV hydralazine.  The sheath was removed and a TR band was placed on the right wrist to achieve patent hemostasis.  The patient left lab in stable condition.   Successful proximal LAD PCI and stenting using double wire  technique protecting the moderate-sized diagonal branch with a long 38 mm resolute Onyx drug-eluting stent encompassing the proximal LAD, the bifurcation and the mid LAD.  The proximal LAD was postdilated with a 3.25 x 12 mm long balloon.  The remainder of his coronary anatomy is free of significant disease.  His LV function was normal by 2D echo.  His enzymes were normal as well.  Because of chest pain post procedure I am going to place him in unit 2H for closer observation.  We will put him on low-dose IV nitroglycerin for blood pressure control.  He will need uninterrupted dual antiplatelet therapy for 12 months. Quay Burow. MD, [facility] 04/25/2020 5:52 PM   CT CORONARY MORPH W/CTA COR W/SCORE W/CA W/CM &/OR WO/CM  Addendum Date: 04/25/2020   ADDENDUM REPORT: 04/25/2020 09:25 EXAM: OVER-READ INTERPRETATION  CT CHEST The following report is an over-read performed by radiologist Dr. Rebekah Chesterfield [facility] on 04/25/2020. This over-read does not include interpretation of cardiac or coronary anatomy or pathology. The coronary calcium score and cardiac CTA interpretation by the cardiologist is attached. COMPARISON:  Chest CT 01/25/2020. FINDINGS: Aortic atherosclerosis. Mild linear scarring in the anterior aspect of the right lower lobe. Within the visualized portions of the thorax there are no suspicious appearing pulmonary nodules or masses, there is no acute consolidative airspace disease, no pleural effusions, no pneumothorax and no lymphadenopathy. Visualized portions of the upper abdomen are unremarkable. There are no aggressive appearing lytic or blastic lesions noted in the visualized portions of the skeleton. IMPRESSION: 1.  Aortic Atherosclerosis (ICD10-I70.0). Electronically Signed   By: Vinnie Langton M.D.   On: 04/25/2020 09:25   Result Date: 04/25/2020 EXAM: Cardiac/Coronary  CT TECHNIQUE: The patient was scanned on a [employer]. FINDINGS: A 120 kV prospective scan  was triggered in the descending thoracic aorta at 111 HU's. Axial non-contrast 3 mm slices were carried out through the heart. The data set was analyzed on a dedicated work station and scored using the [place]gins. Gantry rotation speed was 250 msecs and collimation was .6 mm. No beta blockade and 0.8 mg of sl NTG was given. The 3D data set was reconstructed in 5% intervals of the 67-82 % of the R-R cycle. Diastolic phases were  analyzed on a dedicated work station using MPR, MIP and VRT modes. The patient received 80 cc of contrast. Aorta: Normal size. Scattered calcifications in the ascending aorta. No dissection. Aortic Valve:  Trileaflet.  No calcifications. Coronary Arteries:  Normal coronary origin.  Right dominance. RCA is a large dominant artery that gives rise to PDA and PLVB. There is minimal calcified plaque in the proximal RCA with associated stenosis of 0-24%. Left main is a large artery that gives rise to LAD and LCX arteries. There is no plaque. LAD is a large vessel that gives rise to a large D1. There is moderate mixed plaque in the mid LAD at the takeoff of a large D1 and then further down in the mid LAD with associated stenosis of 50-69%. There is minimal scattered plaque in the diagonal with associated stenosis of 0-24%. LCX is a non-dominant artery that gives rise to one large OM1 branch. There is mild calcified plaque in the proximal OM1 with associated stenosis of 25-49%. Other findings: Normal pulmonary vein drainage into the left atrium. Normal let atrial appendage without a thrombus. Normal size of the pulmonary artery. IMPRESSION: 1. Coronary calcium score of 178. This was 70th percentile for age and sex matched control. 2.  Normal coronary origin with right dominance. 3.  Moderate atherosclerosis of LAD.  CAD RADS 3. 4. Consider symptom-guided anti-ischemic and preventive pharmacotherapy as well as risk factor modification per guideline-directed care. 5.  This study has been submitted  for FFR analysis. Fransico Him Electronically Signed: By: Fransico Him On: 04/24/2020 11:38   CT CORONARY FRACTIONAL FLOW RESERVE DATA PREP  Result Date: 04/24/2020 EXAM: FFRCT ANALYSIS FINDINGS: FFRct analysis was performed on the original cardiac CT angiogram dataset. Diagrammatic representation of the FFRct analysis is provided in a separate PDF document in PACS. This dictation was created using the PDF document and an interactive 3D model of the results. 3D model is not available in the EMR/PACS. Normal FFR range is >0.80. 1. Left Main: No significant stenosis. LM FFR = 0.99. 2. LAD: Possible significant stenosis Mid LAD. Proximal FFR = 0.98, Mid FFR = 0.72, Distal FFR = 0.61. 3. LCX: No significant stenosis. Proximal FFR = 0.99, Mid FFR = 0.91, Distal FFR = 0.86. 4. RCA: No significant stenosis. Proximal FFR = 0.98, Mid FFR = 0.93, Distal FFR = 0.91. IMPRESSION: 1. CT Coronary Flow analysis demonstrates possible flow limiting lesion in the mid LAD. 2.    Recommend Cardiac catheterization. Electronically Signed   By: Fransico Him   On: 04/24/2020 16:57   ECHOCARDIOGRAM COMPLETE  Result Date: 04/24/2020    ECHOCARDIOGRAM REPORT   Patient Name:   LUCIEN BUDNEY Date of Exam: 04/24/2020 Medical Rec #:  [id number]        Height:       69.0 in Accession #:    [id number]       Weight:       191.8 lb Date of Birth:  1956/09/08        BSA:          2.030 m Patient Age:    44 years         BP:           131/83 mmHg Patient Gender: M                HR:           50 bpm. Exam Location:  Inpatient Procedure: 2D Echo Indications:    chest  pain 786.50  History:        Patient has no prior history of Echocardiogram examinations.                 Risk Factors:Family History of Coronary Artery Disease.  Sonographer:    Johny Chess Referring Phys: [id number] HAO MENG IMPRESSIONS  1. Left ventricular ejection fraction, by estimation, is 65 to 70%. The left ventricle has normal function. The left ventricle has no  regional wall motion abnormalities. Left ventricular diastolic parameters were normal.  2. Right ventricular systolic function is normal. The right ventricular size is normal.  3. The mitral valve is normal in structure. Trivial mitral valve regurgitation.  4. The aortic valve is normal in structure. Aortic valve regurgitation is trivial. FINDINGS  Left Ventricle: Left ventricular ejection fraction, by estimation, is 65 to 70%. The left ventricle has normal function. The left ventricle has no regional wall motion abnormalities. The left ventricular internal cavity size was normal in size. There is  no left ventricular hypertrophy. Left ventricular diastolic parameters were normal. Right Ventricle: The right ventricular size is normal. No increase in right ventricular wall thickness. Right ventricular systolic function is normal. Left Atrium: Left atrial size was normal in size. Right Atrium: Right atrial size was normal in size. Pericardium: There is no evidence of pericardial effusion. Mitral Valve: The mitral valve is normal in structure. Trivial mitral valve regurgitation. Tricuspid Valve: The tricuspid valve is normal in structure. Tricuspid valve regurgitation is trivial. Aortic Valve: The aortic valve is normal in structure. Aortic valve regurgitation is trivial. Pulmonic Valve: The pulmonic valve was normal in structure. Pulmonic valve regurgitation is not visualized. Aorta: The aortic root is normal in size and structure. IAS/Shunts: No atrial level shunt detected by color flow Doppler.  LEFT VENTRICLE PLAX 2D LVIDd:         4.30 cm  Diastology LVIDs:         2.70 cm  LV e' lateral:   12.80 cm/s LV PW:         0.80 cm  LV E/e' lateral: 5.2 LV IVS:        0.90 cm  LV e' medial:    8.49 cm/s LVOT diam:     1.90 cm  LV E/e' medial:  7.8 LV SV:         64 LV SV Index:   32 LVOT Area:     2.84 cm  RIGHT VENTRICLE             IVC RV S prime:     12.00 cm/s  IVC diam: 1.40 cm TAPSE (M-mode): 1.6 cm LEFT ATRIUM              Index       RIGHT ATRIUM           Index LA diam:        3.70 cm 1.82 cm/m  RA Area:     10.20 cm LA Vol (A2C):   46.4 ml 22.86 ml/m RA Volume:   19.50 ml  9.61 ml/m LA Vol (A4C):   38.7 ml 19.07 ml/m LA Biplane Vol: 43.4 ml 21.38 ml/m  AORTIC VALVE LVOT Vmax:   97.90 cm/s LVOT Vmean:  60.600 cm/s LVOT VTI:    0.226 m  AORTA Ao Root diam: 3.40 cm Ao Asc diam:  3.10 cm MITRAL VALVE MV Area (PHT): 4.49 cm    SHUNTS MV Decel Time: 169 msec    Systemic VTI:  0.23  m MV E velocity: 66.00 cm/s  Systemic Diam: 1.90 cm MV A velocity: 67.30 cm/s MV E/A ratio:  0.98 Dorris Carnes MD Electronically signed by Dorris Carnes MD Signature Date/Time: 04/24/2020/11:10:26 AM    Final    Disposition   Pt is being discharged home today in improved condition.  Follow-up Plans & Appointments     Discharge Instructions    Diet - low sodium heart healthy   Complete by: As directed    Increase activity slowly   Complete by: As directed       Discharge Medications   Allergies as of 04/30/2020      Reactions   Amitriptyline    Made me feel strange   Keflex [cephalexin]    Stomach pain   Topiramate    Made me feel strange      Medication List    TAKE these medications   ascorbic acid 500 MG tablet Commonly known as: VITAMIN C Take 500 mg by mouth daily.   aspirin 81 MG chewable tablet Chew 1 tablet (81 mg total) by mouth daily.   atorvastatin 80 MG tablet Commonly known as: LIPITOR Take 1 tablet (80 mg total) by mouth daily. What changed: when to take this   clopidogrel 75 MG tablet Commonly known as: PLAVIX Take 1 tablet (75 mg total) by mouth daily with breakfast.   diclofenac Sodium 1 % Gel Commonly known as: VOLTAREN Apply 2 g topically 4 (four) times daily.   DIGESTIVE ENZYME PO Take 1 tablet by mouth in the morning and at bedtime.   gabapentin 300 MG capsule Commonly known as: NEURONTIN Take 300 mg by mouth 3 (three) times daily.   isosorbide mononitrate 30 MG 24 hr  tablet Commonly known as: IMDUR Take 1 tablet (30 mg total) by mouth daily. Start taking on: May 01, 2020   MAGNESIUM GLYCINATE PLUS PO Take 1 tablet by mouth daily. 484m   nitroGLYCERIN 0.4 MG SL tablet Commonly known as: NITROSTAT Place 1 tablet (0.4 mg total) under the tongue every 5 (five) minutes x 3 doses as needed for chest pain.   pantoprazole 40 MG tablet Commonly known as: PROTONIX Take 1 tablet (40 mg total) by mouth daily at 6 (six) AM.   VITAMIN D3 PO Take 1 tablet by mouth daily. 75000IU          Outstanding Labs/Studies   P2Y12  Duration of Discharge Encounter   Greater than 30 minutes including physician time.  Signed, RRosaria Ferries PA-C 04/30/2020, 2:43 PM

## 2020-04-30 NOTE — ED Notes (Signed)
Gave the patient some ginger ale patient is resting with family at bedside and call bell in reach

## 2020-05-05 LAB — PLATELET INHIBITION P2Y12: Platelet Function  P2Y12: 71 [PRU] — ABNORMAL LOW (ref 182–335)

## 2020-05-10 ENCOUNTER — Encounter: Payer: Self-pay | Admitting: Physician Assistant

## 2020-05-10 NOTE — Progress Notes (Signed)
Cardiology Office Note    Date:  05/11/2020   ID:  Ronald Tapia, DOB August 05, 1956, MRN [id number]  PCP:  Osie Cheeks, PA-C  Cardiologist:  Fransico Him, MD  Electrophysiologist:  None   Chief Complaint: f/u chest pain  History of Present Illness:   Ronald Tapia is a 64 y.o. male with history of CAD (s/p PCI of the LAD on 04/25/2020), lymphoma, 2-year history of debilitating headaches ultimately found to be due to CSF leak s/p repair at [facility], GERD, borderline hypertension, hyperlipidemia, & aortic atherosclerosis who presents for post-hospital follow-up.  He was recently admitted for chest pain in July 2021. Troponins remained normal although patient had ongoing mild chest pain/left arm pain. Echocardiogram was performed and showed normal LV function without any significant valvular abnormalities. Coronary CT angiography possible significant stenosis in the mid LAD with an FFR of 0.72 in the mid vessel and 0.61 in the distal vessel. Overread was unremarkable except aortic atherosclerosis. He underwent diagnostic catheterization on July 19 confirming findings on coronary CTA, with a severe 90% proximal to mid LAD stenosis and 75% mid LAD stenosis, coronaries otherwise normal. He underwent successful PCI and stenting of the LAD with a 2.75 x 38 mm resolute Onyx drug-eluting stent. Post-procedure he continued to have chest pain although EKG remained normal. PPI was added. He was readmitted 7/23-7/24/21 with recurrent chest pain without significant change with NTG. There was an area just above and lateral to his left breast which was slightly tender. His workup was benign with normal sed rate, normal EKG, and completely normal enzymes. His P2Y12 was 71 indicating adequate Plavix response. He was continued on home gabapentin and started on Imdur/Voltaren empirically.  He returns for follow-up today overall doing OK. He has begun walking regularly and is up to 12 minutes twice daily. He  still notices occasional chest discomfort on that same side, worse upon waking up in the morning, specifically on the side he sleeps on. It is non-exertional. He actually feels better when he walks. He stopped Imdur due to headaches and lack of clinical benefit. He has notice a generalized sluggishness in the morning that he wonders if could be due to the atorvastatin. He also reports cold intolerance in the morning. He has also noticed some numbness in the tips of his left hand's fingers as well as an occasional burning sensation on the side of his right lower leg especially at night. He has had this in the past as well, historically aggravated by cold weather. This too feels better with walking.  Labwork independently reviewed:  04/2020 Hgb 14.9, Plt 143, ESR wnl, Covid negative, K 3.7, Cr 1.06, glucose 100, LDL 178   Past Medical History:  Diagnosis Date  . Anxiety   . Aortic atherosclerosis ([place])   . CAD (coronary artery disease)    a. 04/2020 Cor CTA: mid LAD dzs (FFR 0.39m 0.61d); b. 04/2020 PCI: LM nl, LAD 90p/m, 774m2.75x3812mesolute Onyx DES), LCX nl, RCA nl.  . [place] . 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 GERD (gastroesophageal reflux disease) 04/24/2020  . Headache   . History of echocardiogram    a. 04/2020 Echo: EF 65-70%, no rwma, triv MR/AI.  . HMarland Kitchenperlipidemia 04/24/2020  . [place] . White coat syndrome without hypertension     Past Surgical History:  Procedure Laterality Date  . BACK SURGERY    .  CORONARY STENT INTERVENTION N/A 04/25/2020   Procedure: CORONARY STENT INTERVENTION;  Surgeon: Lorretta Harp, MD;  Location: [place] [facility];  Service: Cardiovascular;  Laterality: N/A;  . LEFT HEART CATH AND CORONARY ANGIOGRAPHY N/A 04/25/2020   Procedure: LEFT HEART CATH AND CORONARY ANGIOGRAPHY;  Surgeon: Lorretta Harp, MD;  Location: [place] [facility];  Service: Cardiovascular;  Laterality: N/A;     Current Medications: Current Meds  Medication Sig  . ascorbic acid (VITAMIN C) 500 MG tablet Take 500 mg by mouth daily.  Marland Kitchen aspirin 81 MG chewable tablet Chew 1 tablet (81 mg total) by mouth daily.  Marland Kitchen atorvastatin (LIPITOR) 80 MG tablet Take 1 tablet (80 mg total) by mouth daily. (Patient taking differently: Take 80 mg by mouth every evening. )  . Cholecalciferol (VITAMIN D3 PO) Take 1 tablet by mouth daily. 75000IU  . clopidogrel (PLAVIX) 75 MG tablet Take 1 tablet (75 mg total) by mouth daily with breakfast.  . diclofenac Sodium (VOLTAREN) 1 % GEL Apply 2 g topically 4 (four) times daily.  . Digestive Enzymes (DIGESTIVE ENZYME PO) Take 1 tablet by mouth in the morning and at bedtime.  . gabapentin (NEURONTIN) 300 MG capsule Take 300 mg by mouth 3 (three) times daily.  Marland Kitchen MAGNESIUM GLYCINATE PLUS PO Take 1 tablet by mouth daily. 472m  . nitroGLYCERIN (NITROSTAT) 0.4 MG SL tablet Place 1 tablet (0.4 mg total) under the tongue every 5 (five) minutes x 3 doses as needed for chest pain.  . pantoprazole (PROTONIX) 40 MG tablet Take 1 tablet (40 mg total) by mouth daily at 6 (six) AM.   Allergies:   Amitriptyline, Keflex [cephalexin], and Topiramate   Social History   Socioeconomic History  . Marital status: Married    Spouse name: Not on file  . Number of children: Not on file  . Years of education: Not on file  . Highest education level: Not on file  Occupational History  . Not on file  Tobacco Use  . Smoking status: Never Smoker  . Smokeless tobacco: Never Used  Vaping Use  . Vaping Use: Never used  Substance and Sexual Activity  . Alcohol use: Not Currently  . Drug use: Never  . Sexual activity: Yes  Other Topics Concern  . Not on file  Social History Narrative  . Not on file   Social Determinants of Health   Financial Resource Strain:   . Difficulty of Paying Living Expenses:   Food Insecurity:   . Worried About RCharity fundraiserin the Last Year:   . RAcademic librarianin the Last Year:   Transportation Needs:   . LFilm/video editor(Medical):   .Marland KitchenLack of Transportation (Non-Medical):   Physical Activity:   . Days of Exercise per Week:   . Minutes of Exercise per Session:   Stress:   . Feeling of Stress :   Social Connections:   . Frequency of Communication with Friends and Family:   . Frequency of Social Gatherings with Friends and Family:   . Attends Religious Services:   . Active Member of Clubs or Organizations:   . Attends CArchivistMeetings:   .Marland KitchenMarital Status:      Family History:  The patient's family history includes Breast cancer in his sister; Diabetes Mellitus II in his father; Heart attack in his father and maternal grandfather; Hypertension in his father; Kidney cancer in his brother; Parkinson's disease in an other family  member.  ROS:   Please see the history of present illness. No bleeding. All other systems are reviewed and otherwise negative.    EKGs/Labs/Other Studies Reviewed:    Studies reviewed are outlined and summarized above. Reports included below if pertinent.  2D echo 04/2020 1. Left ventricular ejection fraction, by estimation, is 65 to 70%. The  left ventricle has normal function. The left ventricle has no regional  wall motion abnormalities. Left ventricular diastolic parameters were  normal.  2. Right ventricular systolic function is normal. The right ventricular  size is normal.  3. The mitral valve is normal in structure. Trivial mitral valve  regurgitation.  4. The aortic valve is normal in structure. Aortic valve regurgitation is  trivial.   LHC 04/2020  Prox LAD to Mid LAD lesion is 90% stenosed.  Mid LAD lesion is 75% stenosed.  A drug-eluting stent was successfully placed using a [place] [id number].  Post intervention, there is a 0% residual stenosis.  Post intervention, there is a 0% residual stenosis.  IMPRESSION: Successful proximal LAD PCI and stenting  using double wire technique protecting the moderate-sized diagonal branch with a long 38 mm resolute Onyx drug-eluting stent encompassing the proximal LAD, the bifurcation and the mid LAD.  The proximal LAD was postdilated with a 3.25 x 12 mm long balloon.  The remainder of his coronary anatomy is free of significant disease.  His LV function was normal by 2D echo.  His enzymes were normal as well.  Because of chest pain post procedure I am going to place him in unit 2H for closer observation.  We will put him on low-dose IV nitroglycerin for blood pressure control.  He will need uninterrupted dual antiplatelet therapy for 12 months.     EKG:  EKG is ordered today, personally reviewed, demonstrating NSR 79bpm no acute STT changes. Normal.   Recent Labs: 04/29/2020: BUN 15; Potassium 3.7; Sodium 136 04/30/2020: Creatinine, Ser 0.93; Hemoglobin 14.9; Platelets 143  Recent Lipid Panel    Component Value Date/Time   CHOL 242 (H) 04/24/2020 0532   TRIG 116 04/24/2020 0532   HDL 41 04/24/2020 0532   CHOLHDL 5.9 04/24/2020 0532   VLDL 23 04/24/2020 0532   LDLCALC 178 (H) 04/24/2020 0532    PHYSICAL EXAM:    VS:  BP 130/90   Pulse 79   Ht '5\' 9"'  (1.753 m)   Wt 187 lb (84.8 kg)   SpO2 94%   BMI 27.62 kg/m   BMI: Body mass index is 27.62 kg/m.  GEN: Well nourished, well developed WM, in no acute distress HEENT: normocephalic, atraumatic Neck: no JVD, carotid bruits, or masses Cardiac: RRR; no murmurs, rubs, or gallops, no edema  Respiratory:  clear to auscultation bilaterally, normal work of breathing GI: soft, nontender, nondistended, + BS MS: no deformity or atrophy, right radial cath site without hematoma or ecchymosis; good pulse Skin: warm and dry, no rash Neuro:  Alert and Oriented x 3, Strength and sensation are intact, follows commands Psych: euthymic mood, full affect  Wt Readings from Last 3 Encounters:  05/11/20 187 lb (84.8 kg)  04/29/20 190 lb 0.6 oz (86.2 kg)  04/25/20  190 lb (86.2 kg)     ASSESSMENT & PLAN:   1. Chest pain of uncertain etiology, in the context of known CAD, also atypical symptoms of sluggishness, cold intolerance, and parasthesias - given the clinical context above it remains unclear to me if his original chest pain was ever anginal to  begin with, especially with the negative diagnostic markers, normal EKG, and persistent discomfort despite PCI. His residual discomfort has not been felt cardiac in nature. He did share with me his clinical history of severe debilitating headaches prior to finally uncovering the diagnosis of CSF leak. It seems feasible that he may have some degree of hyperalgesia due to prior chronic pain exposure. We will continue to manage conservatively and see how he does with continued advancement of activity. I am grateful he feels better with physical activity. He requests return to work which I think is reasonable.  2. Atypical symptoms of sluggishness, cold intolerance, and parasthesias - will check thyroid and electrolytes. Will reduce his atorvastatin to 45m as he feels this might have exacerbated his fatigue. Will also plan to add low dose amlodipine to achieve more consistent BP control. Asked him to give uKoreaan update in about 2 weeks to see how he is feeling. Also encouraged him to consider cardiac rehab program although he relays it may not be conducive to his work schedule. 3. Hyperlipidemia - decrease atorvastatin as above, as a trial. He needs baseline LFTs which we'll get with labs today. Recheck liver/lipids in 6 weeks. 4. H/o borderline HTN - brings in a log of BPs, not infrequently >130/80. Will add low dose amlodipine 2.525mdaily. Interestingly he reports his prior parasthesias were cold-responsive, raising question of vasospastic phenomena. Theoretically amlodipine would help but if these continue he will need to see his PCP for further eval.  Disposition: F/u with Dr. TuRadford Paxr me in 3 months.  Medication  Adjustments/Labs and Tests Ordered: Current medicines are reviewed at length with the patient today.  Concerns regarding medicines are outlined above. Medication changes, Labs and Tests ordered today are summarized above and listed in the Patient Instructions accessible in Encounters.   Signed, DaCharlie PitterPA-C  05/11/2020 2:42 PM    [facility] [address]  [id number]: [phone]: [phone]

## 2020-05-11 ENCOUNTER — Ambulatory Visit (INDEPENDENT_AMBULATORY_CARE_PROVIDER_SITE_OTHER): Payer: Managed Care, Other (non HMO) | Admitting: Physician Assistant

## 2020-05-11 ENCOUNTER — Encounter: Payer: Self-pay | Admitting: *Deleted

## 2020-05-11 ENCOUNTER — Encounter: Payer: Self-pay | Admitting: Physician Assistant

## 2020-05-11 ENCOUNTER — Other Ambulatory Visit: Payer: Self-pay

## 2020-05-11 VITALS — BP 130/90 | HR 79 | Ht 69.0 in | Wt 187.0 lb

## 2020-05-11 DIAGNOSIS — R5383 Other fatigue: Secondary | ICD-10-CM

## 2020-05-11 DIAGNOSIS — E785 Hyperlipidemia, unspecified: Secondary | ICD-10-CM | POA: Diagnosis not present

## 2020-05-11 DIAGNOSIS — R079 Chest pain, unspecified: Secondary | ICD-10-CM | POA: Diagnosis not present

## 2020-05-11 DIAGNOSIS — R030 Elevated blood-pressure reading, without diagnosis of hypertension: Secondary | ICD-10-CM | POA: Diagnosis not present

## 2020-05-11 DIAGNOSIS — I2510 Atherosclerotic heart disease of native coronary artery without angina pectoris: Secondary | ICD-10-CM | POA: Diagnosis not present

## 2020-05-11 DIAGNOSIS — R200 Anesthesia of skin: Secondary | ICD-10-CM

## 2020-05-11 MED ORDER — ATORVASTATIN CALCIUM 40 MG PO TABS
40.0000 mg | ORAL_TABLET | Freq: Every day | ORAL | 3 refills | Status: DC
Start: 2020-05-11 — End: 2020-10-13

## 2020-05-11 MED ORDER — AMLODIPINE BESYLATE 2.5 MG PO TABS
2.5000 mg | ORAL_TABLET | Freq: Every day | ORAL | 3 refills | Status: DC
Start: 2020-05-11 — End: 2020-10-13

## 2020-05-11 NOTE — Patient Instructions (Addendum)
Medication Instructions:  Your physician has recommended you make the following change in your medication:  1.  REDUCE the Atorvastatin to 40 mg daily... You may cut the 80 mg tablets in 1/2 to use them up.  I have sent in a new RX for the 40 mg tablets 2.  START Amlodipine 2.5 mg taking 1 daily  *If you need a refill on your cardiac medications before your next appointment, please call your pharmacy*   Lab Work: TODAY:  CMET, MAG & TSH  06/24/20:  COME TO THE LAB FASTING FOR LIPID & LFT  If you have labs (blood work) drawn today and your tests are completely normal, you will receive your results only by: Marland Kitchen MyChart Message (if you have MyChart) OR . A paper copy in the mail If you have any lab test that is abnormal or we need to change your treatment, we will call you to review the results.   Testing/Procedures: None ordered    Follow-Up: At [facility], you and your health needs are our priority.  As part of our continuing mission to provide you with exceptional heart care, we have created designated Provider Care Teams.  These Care Teams include your primary Cardiologist (physician) and Advanced Practice Providers (APPs -  Physician Assistants and Nurse Practitioners) who all work together to provide you with the care you need, when you need it.  We recommend signing up for the patient portal called "MyChart".  Sign up information is provided on this After Visit Summary.  MyChart is used to connect with patients for Virtual Visits (Telemedicine).  Patients are able to view lab/test results, encounter notes, upcoming appointments, etc.  Non-urgent messages can be sent to your provider as well.   To learn more about what you can do with MyChart, go to [email or web address].    Your next appointment:   3 month(s)   08/10/2020 ARRIVE AT 3:25 TO SEE DR TURNER  The format for your next appointment:   In Person  Provider:   You may see Fransico Him, MD or one of the following  Advanced Practice Providers on your designated Care Team:    Melina Copa, PA-C  Ermalinda Barrios, PA-C   Other Instructions

## 2020-05-12 LAB — COMPREHENSIVE METABOLIC PANEL
ALT: 23 IU/L (ref 0–44)
AST: 14 IU/L (ref 0–40)
Albumin/Globulin Ratio: 1.6 (ref 1.2–2.2)
Albumin: 4.6 g/dL (ref 3.8–4.8)
Alkaline Phosphatase: 60 IU/L (ref 48–121)
BUN/Creatinine Ratio: 14 (ref 10–24)
BUN: 14 mg/dL (ref 8–27)
Bilirubin Total: 0.5 mg/dL (ref 0.0–1.2)
CO2: 27 mmol/L (ref 20–29)
Calcium: 9.7 mg/dL (ref 8.6–10.2)
Chloride: 99 mmol/L (ref 96–106)
Creatinine, Ser: 1.03 mg/dL (ref 0.76–1.27)
GFR calc Af Amer: 89 mL/min/{1.73_m2} (ref >59)
GFR calc non Af Amer: 77 mL/min/{1.73_m2} (ref >59)
Globulin, Total: 2.8 g/dL (ref 1.5–4.5)
Glucose: 99 mg/dL (ref 65–99)
Potassium: 4.1 mmol/L (ref 3.5–5.2)
Sodium: 138 mmol/L (ref 134–144)
Total Protein: 7.4 g/dL (ref 6.0–8.5)

## 2020-05-12 LAB — TSH: TSH: 2.19 u[IU]/mL (ref 0.450–4.500)

## 2020-05-12 LAB — MAGNESIUM: Magnesium: 2.1 mg/dL (ref 1.6–2.3)

## 2020-05-22 ENCOUNTER — Other Ambulatory Visit: Payer: Self-pay | Admitting: Physician Assistant

## 2020-05-25 NOTE — Progress Notes (Signed)
Patient already underwent cath and mid LAD lesion was treated

## 2020-05-26 ENCOUNTER — Other Ambulatory Visit: Payer: Self-pay

## 2020-05-26 DIAGNOSIS — R200 Anesthesia of skin: Secondary | ICD-10-CM

## 2020-05-26 DIAGNOSIS — R079 Chest pain, unspecified: Secondary | ICD-10-CM

## 2020-05-26 DIAGNOSIS — E785 Hyperlipidemia, unspecified: Secondary | ICD-10-CM

## 2020-05-26 DIAGNOSIS — R5383 Other fatigue: Secondary | ICD-10-CM

## 2020-05-26 DIAGNOSIS — I2510 Atherosclerotic heart disease of native coronary artery without angina pectoris: Secondary | ICD-10-CM

## 2020-05-26 DIAGNOSIS — R030 Elevated blood-pressure reading, without diagnosis of hypertension: Secondary | ICD-10-CM

## 2020-05-26 NOTE — Addendum Note (Signed)
Addended by: Antonieta Iba on: 05/26/2020 08:52 AM   Modules accepted: Orders

## 2020-06-24 ENCOUNTER — Other Ambulatory Visit: Payer: Managed Care, Other (non HMO)

## 2020-06-29 LAB — LIPID PANEL
Chol/HDL Ratio: 2.9 ratio (ref 0.0–5.0)
Cholesterol, Total: 117 mg/dL (ref 100–199)
HDL: 40 mg/dL (ref >39)
LDL Chol Calc (NIH): 65 mg/dL (ref 0–99)
Triglycerides: 52 mg/dL (ref 0–149)
VLDL Cholesterol Cal: 12 mg/dL (ref 5–40)

## 2020-06-29 LAB — HEPATIC FUNCTION PANEL
ALT: 24 IU/L (ref 0–44)
AST: 18 IU/L (ref 0–40)
Albumin: 4.6 g/dL (ref 3.8–4.8)
Alkaline Phosphatase: 64 IU/L (ref 44–121)
Bilirubin Total: 0.5 mg/dL (ref 0.0–1.2)
Bilirubin, Direct: 0.13 mg/dL (ref 0.00–0.40)
Total Protein: 7.2 g/dL (ref 6.0–8.5)

## 2020-08-10 ENCOUNTER — Other Ambulatory Visit: Payer: Self-pay

## 2020-08-10 ENCOUNTER — Telehealth (INDEPENDENT_AMBULATORY_CARE_PROVIDER_SITE_OTHER): Payer: Managed Care, Other (non HMO) | Admitting: Cardiology

## 2020-08-10 VITALS — BP 118/78 | HR 78 | Ht 69.0 in | Wt 176.0 lb

## 2020-08-10 DIAGNOSIS — I10 Essential (primary) hypertension: Secondary | ICD-10-CM

## 2020-08-10 DIAGNOSIS — E7800 Pure hypercholesterolemia, unspecified: Secondary | ICD-10-CM

## 2020-08-10 DIAGNOSIS — I25118 Atherosclerotic heart disease of native coronary artery with other forms of angina pectoris: Secondary | ICD-10-CM | POA: Diagnosis not present

## 2020-08-10 NOTE — Patient Instructions (Signed)

## 2020-08-10 NOTE — Progress Notes (Signed)
Virtual Visit via Telephone Note   This visit type was conducted due to national recommendations for restrictions regarding the COVID-19 Pandemic (e.g. social distancing) in an effort to limit this patient's exposure and mitigate transmission in our community.  Due to his co-morbid illnesses, this patient is at least at moderate risk for complications without adequate follow up.  This format is felt to be most appropriate for this patient at this time.  The patient did not have access to video technology/had technical difficulties with video requiring transitioning to audio format only (telephone).  All issues noted in this document were discussed and addressed.  No physical exam could be performed with this format.  Please refer to the patient's chart for his  consent to telehealth for [facility].    Date:  08/10/2020   ID:  Ronald Tapia, DOB [birth year], MRN [id number] The patient was identified using 2 identifiers.  Patient Location: Home Provider Location: Office/Clinic  PCP:  Osie Cheeks, PA-C  Cardiologist:  Fransico Him, MD  Electrophysiologist:  None   Evaluation Performed:  Follow-Up Visit  Chief Complaint:  CAD, HTN, HLD  History of Present Illness:    Ronald Tapia is a 64 y.o. male with with history of CAD (s/p PCI of the LAD on 04/25/2020), lymphoma, 2-year history of debilitating headaches ultimately found to be due to CSF leak s/p repair at [facility], GERD, borderline hypertension, hyperlipidemia, & aortic atherosclerosis who presents for post-hospital follow-up.  He was admitted for chest pain in July 2021. Echo showed normal LV function without any significant valvular abnormalities. Coronary CT angiography showed possible significant stenosis in the mid LAD with an FFR of 0.72 in the mid vessel and 0.61 in the distal vessel and aortic atherosclerosis. Cath showed severe 90% proximal to mid LAD stenosis and 75% mid LAD stenosis, coronaries otherwise normal. He  underwent successful PCI and stenting of the LAD. He was readmitted 7/23-7/24/21 with recurrent chest pain without significant change with NTG. There was an area just above and lateral to his left breast which was slightly tender. His workup was benign with normal sed rate, normal EKG, and completely normal enzymes. His P2Y12 was 71 indicating adequate Plavix response. He was continued on home gabapentin and started on Imdur/Voltaren empirically.  He was seen back in clinic by Melina Copa, PA in August and was doing well. He was walking regularly and was up to 12 minutes twice daily. He would still notice occasional chest discomfort on that same side, worse upon waking up in the morning, specifically on the side he sleeps on and actually would feel better when he walked. He stopped Imdur due to headaches and lack of clinical benefit.   His atorvastatin was reduced due to possible SE of parasthesias, sluggishness and cold intolerance.  Amlodipine low dose was added for better BP control.    He is here today for followup and is doing well.  He recently had COVID 19 and was out of work.  For a few days after getting dx he had some heaviness on his chest that was constant for about 4 days.  Cxray was normal and he was given an inhaler and his symptoms resolved. He denies any further chest pain or pressure, SOB, DOE, PND, orthopnea, LE edema, dizziness, palpitations or syncope. He is compliant with his meds and is tolerating meds with no SE.    The patient recently  had symptoms concerning for COVID-19 infection (fever, chills, cough, or new shortness of breath)  and was COVID + and has just returned to work.    Past Medical History:  Diagnosis Date  . Anxiety   . Aortic atherosclerosis ([place])   . CAD (coronary artery disease)    a. 04/2020 Cor CTA: mid LAD dzs (FFR 0.43m 0.61d); b. 04/2020 PCI: LM nl, LAD 90p/m, 762m2.75x3815mesolute Onyx DES), LCX nl, RCA nl.  . [place] ([place] . CSF leak    CSF fistula,  s/p repair, chronic HAs, [facility]   . GERD (gastroesophageal reflux disease) 04/24/2020  . Headache   . History of echocardiogram    a. 04/2020 Echo: EF 65-70%, no rwma, triv MR/AI.  . HMarland Kitchenperlipidemia 04/24/2020  . [place]oma ([place] . White coat syndrome without hypertension    Past Surgical History:  Procedure Laterality Date  . BACK SURGERY    . CORONARY STENT INTERVENTION N/A 04/25/2020   Procedure: CORONARY STENT INTERVENTION;  Surgeon: BerLorretta HarpD;  Location: [facility] [place] [facility];  Service: Cardiovascular;  Laterality: N/A;  . LEFT HEART CATH AND CORONARY ANGIOGRAPHY N/A 04/25/2020   Procedure: LEFT HEART CATH AND CORONARY ANGIOGRAPHY;  Surgeon: BerLorretta HarpD;  Location: [facility] [place] [facility];  Service: Cardiovascular;  Laterality: N/A;     Current Meds  Medication Sig  . amLODipine (NORVASC) 2.5 MG tablet Take 1 tablet (2.5 mg total) by mouth daily.  . aMarland Kitchencorbic acid (VITAMIN C) 500 MG tablet Take 500 mg by mouth daily.  . aMarland Kitchenpirin 81 MG chewable tablet Chew 1 tablet (81 mg total) by mouth daily.  . aMarland Kitchenorvastatin (LIPITOR) 40 MG tablet Take 1 tablet (40 mg total) by mouth daily.  . Cholecalciferol (VITAMIN D3 PO) Take 1 tablet by mouth daily. 75000IU  . clopidogrel (PLAVIX) 75 MG tablet Take 1 tablet (75 mg total) by mouth daily with breakfast.  . diclofenac Sodium (VOLTAREN) 1 % GEL Apply 2 g topically 4 (four) times daily.  . Digestive Enzymes (DIGESTIVE ENZYME PO) Take 1 tablet by mouth in the morning and at bedtime.  . gabapentin (NEURONTIN) 300 MG capsule Take 300 mg by mouth 3 (three) times daily.  . nitroGLYCERIN (NITROSTAT) 0.4 MG SL tablet Place 1 tablet (0.4 mg total) under the tongue every 5 (five) minutes x 3 doses as needed for chest pain.  . pantoprazole (PROTONIX) 40 MG tablet Take 1 tablet (40 mg total) by mouth daily at 6 (six) AM.  . SYMBICORT 80-4.5 MCG/ACT inhaler Inhale into the lungs.     Allergies:   Amitriptyline, Keflex  [cephalexin], and Topiramate   Social History   Tobacco Use  . Smoking status: Never Smoker  . Smokeless tobacco: Never Used  Vaping Use  . Vaping Use: Never used  Substance Use Topics  . Alcohol use: Not Currently  . Drug use: Never     Family Hx: The patient's family history includes Breast cancer in his sister; Diabetes Mellitus II in his father; Heart attack in his father and maternal grandfather; Hypertension in his father; Kidney cancer in his brother; Parkinson's disease in an other family member.  ROS:   Please see the history of present illness.    none All other systems reviewed and are negative.   Prior CV studies:   The following studies were reviewed today:  cath  Labs/Other Tests and Data Reviewed:    EKG:  No ECG reviewed.  Recent Labs: 04/30/2020: Hemoglobin 14.9; Platelets 143 05/11/2020: BUN 14; Creatinine, Ser 1.03; Magnesium 2.1;  Potassium 4.1; Sodium 138; TSH 2.190 06/28/2020: ALT 24   Recent Lipid Panel Lab Results  Component Value Date/Time   CHOL 117 06/28/2020 08:39 AM   TRIG 52 06/28/2020 08:39 AM   HDL 40 06/28/2020 08:39 AM   CHOLHDL 2.9 06/28/2020 08:39 AM   CHOLHDL 5.9 04/24/2020 05:32 AM   LDLCALC 65 06/28/2020 08:39 AM    Wt Readings from Last 3 Encounters:  08/10/20 176 lb (79.8 kg)  05/11/20 187 lb (84.8 kg)  04/29/20 190 lb 0.6 oz (86.2 kg)        Objective:    Vital Signs:  BP 118/78   Pulse 78   Ht _0  (1.753 m)   Wt 176 lb (79.8 kg)   BMI 25.99 kg/m    VITAL SIGNS:  reviewed GEN:  no acute distress EYES:  sclerae anicteric, EOMI - Extraocular Movements Intact RESPIRATORY:  normal respiratory effort, symmetric expansion CARDIOVASCULAR:  no peripheral edema SKIN:  no rash, lesions or ulcers. MUSCULOSKELETAL:  no obvious deformities. NEURO:  alert and oriented x 3, no obvious focal deficit PSYCH:  normal affect  ASSESSMENT & PLAN:    1. ASCAD -Coronary CT angiography showed possible significant stenosis in  the mid LAD with an FFR of 0.72 in the mid vessel and 0.61 in the distal vessel and aortic atherosclerosis.  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 He underwent successful PCI and stenting of the LAD. -continued to have chronic CP with negative workup after PCI and felt to possibly be related to possible GERD -he has had a myriad of sx since his PCI including sluggishness, parasthesias and cold intolerance with normal TSH -chest pain has completely resolved -continue ASA 28m daily, Plavix 733mdaily and statin  2.  HTN -BP controlled -continue amlodipine 2.48m48maily  3.  HLD -LDL goal < 70 -LDL was 65 on 06/28/2020 -his joint pain has improved on lower dose of statin -continue Atorvastatin 68m28mily  COVID-19 Education: [place]:   Today, I have spent 20 minutes with the patient with telehealth technology discussing the above problems.     Medication Adjustments/Labs and Tests Ordered: Current medicines are reviewed at length with the patient today.  Concerns regarding medicines are outlined above.   Tests Ordered: No orders of the defined types were placed in this encounter.   Medication Changes: No orders of the defined types were placed in this encounter.   Follow Up:  In Person in 6 month(s)  Signed, TracFransico Him  08/10/2020 3:28 PM    [facility]

## 2020-09-16 ENCOUNTER — Emergency Department (HOSPITAL_COMMUNITY)
Admission: EM | Admit: 2020-09-16 | Discharge: 2020-09-16 | Disposition: A | Discharge status: Home / Self Care | Payer: Managed Care, Other (non HMO) | Attending: Emergency Medicine | Admitting: Emergency Medicine

## 2020-09-16 ENCOUNTER — Encounter (HOSPITAL_COMMUNITY): Payer: Self-pay | Admitting: Cardiology

## 2020-09-16 ENCOUNTER — Emergency Department (HOSPITAL_COMMUNITY): Payer: Managed Care, Other (non HMO)

## 2020-09-16 ENCOUNTER — Other Ambulatory Visit: Payer: Self-pay

## 2020-09-16 DIAGNOSIS — Z7982 Long term (current) use of aspirin: Secondary | ICD-10-CM | POA: Diagnosis not present

## 2020-09-16 DIAGNOSIS — R079 Chest pain, unspecified: Secondary | ICD-10-CM | POA: Diagnosis not present

## 2020-09-16 DIAGNOSIS — Z859 Personal history of malignant neoplasm, unspecified: Secondary | ICD-10-CM | POA: Insufficient documentation

## 2020-09-16 DIAGNOSIS — Z79899 Other long term (current) drug therapy: Secondary | ICD-10-CM | POA: Insufficient documentation

## 2020-09-16 DIAGNOSIS — R0789 Other chest pain: Secondary | ICD-10-CM | POA: Diagnosis present

## 2020-09-16 DIAGNOSIS — I2510 Atherosclerotic heart disease of native coronary artery without angina pectoris: Secondary | ICD-10-CM | POA: Diagnosis not present

## 2020-09-16 DIAGNOSIS — I119 Hypertensive heart disease without heart failure: Secondary | ICD-10-CM | POA: Diagnosis not present

## 2020-09-16 LAB — BASIC METABOLIC PANEL
Anion gap: 9 (ref 5–15)
BUN: 15 mg/dL (ref 8–23)
CO2: 28 mmol/L (ref 22–32)
Calcium: 9.4 mg/dL (ref 8.9–10.3)
Chloride: 101 mmol/L (ref 98–111)
Creatinine, Ser: 0.99 mg/dL (ref 0.61–1.24)
GFR, Estimated: >60 mL/min (ref >60)
Glucose, Bld: 104 mg/dL — ABNORMAL HIGH (ref 70–99)
Potassium: 3.6 mmol/L (ref 3.5–5.1)
Sodium: 138 mmol/L (ref 135–145)

## 2020-09-16 LAB — PROTIME-INR
INR: 1.1 (ref 0.8–1.2)
Prothrombin Time: 13.6 seconds (ref 11.4–15.2)

## 2020-09-16 LAB — CBC
HCT: 46.5 % (ref 39.0–52.0)
Hemoglobin: 15 g/dL (ref 13.0–17.0)
MCH: 30.5 pg (ref 26.0–34.0)
MCHC: 32.3 g/dL (ref 30.0–36.0)
MCV: 94.7 fL (ref 80.0–100.0)
Platelets: 148 10*3/uL — ABNORMAL LOW (ref 150–400)
RBC: 4.91 MIL/uL (ref 4.22–5.81)
RDW: 12.3 % (ref 11.5–15.5)
WBC: 4.8 10*3/uL (ref 4.0–10.5)
nRBC: 0 % (ref 0.0–0.2)

## 2020-09-16 LAB — TROPONIN I (HIGH SENSITIVITY)
Troponin I (High Sensitivity): 3 ng/L (ref <18)
Troponin I (High Sensitivity): <2 ng/L (ref <18)

## 2020-09-16 MED ORDER — SODIUM CHLORIDE 0.9 % IV BOLUS
500.0000 mL | Freq: Once | INTRAVENOUS | Status: AC
  Administered 2020-09-16: 500 mL via INTRAVENOUS

## 2020-09-16 MED ORDER — KETOROLAC TROMETHAMINE 30 MG/ML IJ SOLN
15.0000 mg | Freq: Once | INTRAMUSCULAR | Status: AC
  Administered 2020-09-16: 15 mg via INTRAVENOUS
  Filled 2020-09-16: qty 1

## 2020-09-16 MED ORDER — IBUPROFEN 200 MG PO TABS: 400.0000 mg | ORAL_TABLET | Freq: Three times a day (TID) | ORAL | 1 refills | Status: AC

## 2020-09-16 NOTE — ED Provider Notes (Signed)
[facility] EMERGENCY DEPARTMENT Provider Note   CSN: [id number] Arrival date & time: 09/16/20  9326     History Chief Complaint  Patient presents with  . Chest Pain    Ronald Tapia is a 64 y.o. male.  HPI Patient with a history of cardiac disease including stenting earlier in the year presents with left upper chest pain.  Onset was yesterday, since that time pain has been persistent.  Pain is not changed with nitroglycerin.  It is nonradiating, focal, in the left upper chest.  Pain is not appreciably changed with arm motion.  There is some associated fatigue associated, no clear dyspnea.  Pain is described as a soreness. Patient's history of CAD is notable for ongoing Plavix, aspirin therapy following placement of a stent earlier in the year. Prior to the onset of symptoms yesterday patient was well, including performing cardiac rehab twice earlier in the week. He accompanies by his wife who assists with the history.     Past Medical History:  Diagnosis Date  . Anxiety   . Aortic atherosclerosis ([place])   . CAD (coronary artery disease)    a. 04/2020 Cor CTA: mid LAD dzs (FFR 0.35m, 0.61d); b. 04/2020 PCI: LM nl, LAD 90p/m, 69m (2.75x72mm Resolute Onyx DES), LCX nl, RCA nl.  . Cancer ([place])   . 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 Depression   . GERD (gastroesophageal reflux disease) 04/24/2020  . Headache   . History of echocardiogram    a. 04/2020 Echo: EF 65-70%, no rwma, triv MR/AI.  Marland Kitchen Hyperlipidemia 04/24/2020  . Lymphoma ([place])   . White coat syndrome without hypertension     Patient Active Problem List   Diagnosis Date Noted  . Chest pain 04/30/2020  . Coronary artery disease of native artery of native heart with stable angina pectoris ([place])   . Essential hypertension   . Chronic headaches 04/25/2020  . Chest pain of uncertain etiology 71/24/5809  . GERD (gastroesophageal reflux disease) 04/24/2020  .  Hyperlipidemia 04/24/2020  . Midsternal chest pain 04/23/2020    Past Surgical History:  Procedure Laterality Date  . BACK SURGERY    . CORONARY STENT INTERVENTION N/A 04/25/2020   Procedure: CORONARY STENT INTERVENTION;  Surgeon: Lorretta Harp, MD;  Location: [place] [facility];  Service: Cardiovascular;  Laterality: N/A;  . LEFT HEART CATH AND CORONARY ANGIOGRAPHY N/A 04/25/2020   Procedure: LEFT HEART CATH AND CORONARY ANGIOGRAPHY;  Surgeon: Lorretta Harp, MD;  Location: [place] [facility];  Service: Cardiovascular;  Laterality: N/A;       Family History  Problem Relation Age of Onset  . Heart attack Father        died in his sleep, unclear if heart attack or stroke  . Hypertension Father   . Diabetes Mellitus II Father   . Breast cancer Sister   . Kidney cancer Brother   . Parkinson's disease Other   . Heart attack Maternal Grandfather        occurs in his 67s    Social History   Tobacco Use  . Smoking status: Never Smoker  . Smokeless tobacco: Never Used  Vaping Use  . Vaping Use: Never used  Substance Use Topics  . Alcohol use: Not Currently  . Drug use: Never    Home Medications Prior to Admission medications   Medication Sig Start Date End Date Taking? Authorizing Provider  amLODipine (NORVASC) 2.5 MG  tablet Take 1 tablet (2.5 mg total) by mouth daily. Patient taking differently: No sig reported 05/11/20 08/10/20 Yes Dunn, Dayna N, PA-C  ascorbic acid (VITAMIN C) 500 MG tablet Take 500 mg by mouth 2 (two) times daily.   Yes [provider]  aspirin EC 81 MG tablet Take 81 mg by mouth daily. Swallow whole.   Yes [provider]  atorvastatin (LIPITOR) 40 MG tablet Take 1 tablet (40 mg total) by mouth daily. Patient taking differently: Take 40 mg by mouth daily with supper. 05/11/20 08/10/20 Yes Dunn, Dayna N, PA-C  Cholecalciferol (VITAMIN D3 PO) Take 7,500 Units by mouth every evening. 75000IU   Yes [provider]  clopidogrel  (PLAVIX) 75 MG tablet Take 1 tablet (75 mg total) by mouth daily with breakfast. 04/27/20  Yes Theora Gianotti, NP  diclofenac Sodium (VOLTAREN) 1 % GEL Apply 2 g topically 4 (four) times daily. Patient taking differently: Apply 2 g topically daily. 04/30/20  Yes Barrett, Evelene Croon, PA-C  Digestive Enzymes (DIGESTIVE ENZYME PO) Take 1 tablet by mouth 2 (two) times daily.   Yes [provider]  fluticasone (FLONASE) 50 MCG/ACT nasal spray Place 1 spray into both nostrils 2 (two) times daily as needed for rhinitis (congestion).   Yes [provider]  gabapentin (NEURONTIN) 300 MG capsule Take 300 mg by mouth 3 (three) times daily.   Yes [provider]  Melatonin 10 MG TABS Take 10 mg by mouth at bedtime.   Yes [provider]  nitroGLYCERIN (NITROSTAT) 0.4 MG SL tablet Place 1 tablet (0.4 mg total) under the tongue every 5 (five) minutes x 3 doses as needed for chest pain. 04/26/20  Yes Theora Gianotti, NP  OVER THE COUNTER MEDICATION Take 1,000 mg by mouth 3 (three) times daily. Beet Root   Yes [provider]  pantoprazole (PROTONIX) 40 MG tablet Take 1 tablet (40 mg total) by mouth daily at 6 (six) AM. 04/27/20  Yes Theora Gianotti, NP  sodium chloride (OCEAN) 0.65 % SOLN nasal spray Place 1 spray into both nostrils 2 (two) times daily as needed for congestion.   Yes [provider]  acetaminophen (TYLENOL) 650 MG CR tablet Take 1,300 mg by mouth 2 (two) times daily as needed for pain.    [provider]  ibuprofen (MOTRIN IB) 200 MG tablet Take 2 tablets (400 mg total) by mouth 3 (three) times daily for 2 days. 09/16/20 09/18/20  Isaiah Serge, NP    Allergies    Imdur [isosorbide nitrate], Amitriptyline, Keflex [cephalexin], and Topiramate  Review of Systems   Review of Systems  Constitutional:       Per HPI, otherwise negative  HENT:       Per HPI, otherwise negative  Respiratory:       Per HPI,  otherwise negative  Cardiovascular:       Per HPI, otherwise negative  Gastrointestinal: Negative for vomiting.  Endocrine:       Negative aside from HPI  Genitourinary:       Neg aside from HPI   Musculoskeletal:       Per HPI, otherwise negative  Skin: Negative.   Neurological: Negative for syncope.    Physical Exam Updated Vital Signs BP (!) 115/101   Pulse 76   Temp 97.9 F (36.6 C) (Oral)   Resp (!) 22   Ht 5\' 9"  (1.753 m)   Wt 90 kg   SpO2 96%   BMI 29.30  kg/m   Physical Exam Vitals and nursing note reviewed.  Constitutional:      General: He is not in acute distress.    Appearance: He is well-developed.  HENT:     Head: Normocephalic and atraumatic.  Eyes:     Extraocular Movements: EOM normal.     Conjunctiva/sclera: Conjunctivae normal.  Cardiovascular:     Rate and Rhythm: Normal rate and regular rhythm.  Pulmonary:     Effort: Pulmonary effort is normal. No respiratory distress.     Breath sounds: No stridor.  Chest:    Abdominal:     General: There is no distension.  Musculoskeletal:        General: No edema.  Skin:    General: Skin is warm and dry.  Neurological:     Mental Status: He is alert and oriented to person, place, and time.  Psychiatric:        Mood and Affect: Mood and affect normal.     ED Results / Procedures / Treatments   Labs (all labs ordered are listed, but only abnormal results are displayed) Labs Reviewed  BASIC METABOLIC PANEL - Abnormal; Notable for the following components:      Result Value   Glucose, Bld 104 (*)    All other components within normal limits  CBC - Abnormal; Notable for the following components:   Platelets 148 (*)    All other components within normal limits  PROTIME-INR  TROPONIN I (HIGH SENSITIVITY)  TROPONIN I (HIGH SENSITIVITY)    EKG EKG Interpretation  Date/Time:  Friday September 16 2020 06:39:03 EST Ventricular Rate:  94 PR Interval:  174 QRS Duration: 76 QT  Interval:  324 QTC Calculation: 405 R Axis:   89 Text Interpretation: Normal sinus rhythm Normal ECG No STEMI Confirmed by Octaviano Glow [id number]) on 09/16/2020 7:21:17 AM   Radiology DG Chest 2 View  Result Date: 09/16/2020 CLINICAL DATA:  Chest pain. EXAM: CHEST - 2 VIEW COMPARISON:  CT 08/12/2020.  04/29/2020. FINDINGS: EKG pads noted over the chest. Mediastinum and hilar structures are normal. Lungs are clear. No pleural effusion or pneumothorax. Heart size normal. Coronary artery stents. No acute bony abnormality. IMPRESSION: No acute cardiopulmonary disease. Coronary artery stents noted. Heart size normal. Electronically Signed   By: Marcello Moores  Register   On: 09/16/2020 07:23    Procedures Procedures (including critical care time)  Medications Ordered in ED Medications  ketorolac (TORADOL) 30 MG/ML injection 15 mg (15 mg Intravenous Given 09/16/20 0856)  sodium chloride 0.9 % bolus 500 mL (0 mLs Intravenous Stopped 09/16/20 1026)    ED Course  I have reviewed the triage vital signs and the nursing notes.  Pertinent labs & imaging results that were available during my care of the patient were reviewed by me and considered in my medical decision making (see chart for details).  Update:, Initial troponin unremarkable.  Update:, On repeat exam patient is in no distress, secondary phone value is normal. He notes he feels somewhat better. We discussed findings thus far including reassuring EKG, x-ray, normal troponin values, no evidence for atypical ACS, no evidence for pneumothorax, PE, pneumonia. Given the patient's recent cardiac intervention, he requested evaluation with cardiology, this has been requested. 2:01 PM I discussed the patient's case with her cardiology team. He has been seen and evaluated. 1 with no evidence for atypical ACS and generally reassuring findings, as above, the patient will follow up in the clinic.  Final Clinical Impression(s) / ED Diagnoses Final  diagnoses:  Atypical chest pain  MDM Number of Diagnoses or Management Options Atypical chest pain: new, needed workup   Amount and/or Complexity of Data Reviewed Clinical lab tests: reviewed Tests in the radiology section of CPT: reviewed Tests in the medicine section of CPT: reviewed Decide to obtain previous medical records or to obtain history from someone other than the patient: yes Obtain history from someone other than the patient: yes Review and summarize past medical records: yes Discuss the patient with other providers: yes Independent visualization of images, tracings, or specimens: yes  Risk of Complications, Morbidity, and/or Mortality Presenting problems: high Diagnostic procedures: high Management options: high  Critical Care Total time providing critical care: < 30 minutes  Patient Progress Patient progress: stable   Rx / DC Orders ED Discharge Orders         Ordered    ibuprofen (MOTRIN IB) 200 MG tablet  3 times daily        09/16/20 1337           Carmin Muskrat, MD 09/16/20 1403

## 2020-09-16 NOTE — ED Notes (Signed)
Provided pt w/turkey bag and cup of ice water

## 2020-09-16 NOTE — ED Triage Notes (Signed)
Patient reports left chest pain with SOB onset this week , denies emesis or diaphoresis , no cough or fever , history of CAD/Coronary Stents , his cardiologist is Dr. Fransico Him.

## 2020-09-16 NOTE — Discharge Instructions (Addendum)
As discussed, your evaluation today has been largely reassuring.  But, it is important that you monitor your condition carefully, and do not hesitate to return to the ED if you develop new, or concerning changes in your condition. ? ?Otherwise, please follow-up with your physician for appropriate ongoing care. ? ?

## 2020-09-16 NOTE — Consult Note (Addendum)
Cardiology Consult:   Patient ID: Ronald Tapia MRN: [id number]; DOB: May 17, 1956   Admission date: 09/16/2020  Primary Care Provider: Osie Cheeks, PA-C [facility] Cardiologist: Fransico Him, MD  [facility] Electrophysiologist:  None   Chief Complaint:  Chest pain  Patient Profile:   Ronald Tapia is a 64 y.o. male with history ofCAD (s/p PCI of the LAD on 04/25/2020), lymphoma,2-year history of debilitating headaches ultimately found to be dueto CSF leaks/p repair at [facility], GERD,borderlinehypertension, hyperlipidemia,&aortic atherosclerosis now presents with chest pain.    He is being seen today for the evaluation of chest pain at the request of Dr. Vanita Panda.   History of Present Illness:   Mr. Trebilcock with above hx and chest pain admit 04/2020 and + cardiac CTA and cath that followed with 90% prox to mid LAD stenosis resulting in PCI to LAD-DES.  troponins were never elevated.   He was readmitted later in July with chest pain felt to be chest wall pain with neg troponin.  He has headaches with imdur.    Hx of COVID 19 in October 21,2021received monoclonal antibody infusion on 10/22.     He did note on OV in Nov some chest heaviness that was constant for 4 days.  Normal CXR and with inhaler symptoms resolved.    On higher dose of statin he had joint pain.   Now presents with left chest pain and SOB, no nausea, or diaphoresis, no cough or fever.  Pain began Wed and has been persistent.  NTG without change in pain.  Pain is soreness.  He did work on Astronomer on Monday and Tuesday in and out of cabinet.     EKG:  The ECG that was done today 09/16/20  was personally reviewed and demonstrates SR and normal EKG.  Hs Troponin  3, <2 Na 138, K+ 3.6 BUN 15 Cr 0.99 Hgb 15 plts 148  2 V CXR IMPRESSION: No acute cardiopulmonary disease. Coronary artery stents noted. Heart size normal  Has rec'd toradol  BP 130/91 to 125/75 Afebrile, P 70s Feels better  after toradol.  Pain is similar to pain after he rec'd stents and dx of chest wall pain   Past Medical History:  Diagnosis Date  . Anxiety   . Aortic atherosclerosis ([place])   . CAD (coronary artery disease)    a. 04/2020 Cor CTA: mid LAD dzs (FFR 0.32m, 0.61d); b. 04/2020 PCI: LM nl, LAD 90p/m, 49m (2.75x28mm Resolute Onyx DES), LCX nl, RCA nl.  . Cancer ([place])   . 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 Depression   . GERD (gastroesophageal reflux disease) 04/24/2020  . Headache   . History of echocardiogram    a. 04/2020 Echo: EF 65-70%, no rwma, triv MR/AI.  Marland Kitchen Hyperlipidemia 04/24/2020  . Lymphoma ([place])   . White coat syndrome without hypertension     Past Surgical History:  Procedure Laterality Date  . BACK SURGERY    . CORONARY STENT INTERVENTION N/A 04/25/2020   Procedure: CORONARY STENT INTERVENTION;  Surgeon: Lorretta Harp, MD;  Location: [place] [facility];  Service: Cardiovascular;  Laterality: N/A;  . LEFT HEART CATH AND CORONARY ANGIOGRAPHY N/A 04/25/2020   Procedure: LEFT HEART CATH AND CORONARY ANGIOGRAPHY;  Surgeon: Lorretta Harp, MD;  Location: [place] [facility];  Service: Cardiovascular;  Laterality: N/A;     Medications Prior to Admission: Prior to Admission medications   Medication Sig Start Date End  Date Taking? Authorizing Provider  amLODipine (NORVASC) 2.5 MG tablet Take 1 tablet (2.5 mg total) by mouth daily. 05/11/20 08/10/20  Dunn, Nedra Hai, PA-C  ascorbic acid (VITAMIN C) 500 MG tablet Take 500 mg by mouth daily.    [provider]  aspirin 81 MG chewable tablet Chew 1 tablet (81 mg total) by mouth daily. 04/27/20   Theora Gianotti, NP  atorvastatin (LIPITOR) 40 MG tablet Take 1 tablet (40 mg total) by mouth daily. 05/11/20 08/10/20  Dunn, Nedra Hai, PA-C  Cholecalciferol (VITAMIN D3 PO) Take 1 tablet by mouth daily. 75000IU    [provider]  clopidogrel (PLAVIX) 75 MG tablet Take 1 tablet (75 mg total)  by mouth daily with breakfast. 04/27/20   Theora Gianotti, NP  diclofenac Sodium (VOLTAREN) 1 % GEL Apply 2 g topically 4 (four) times daily. 04/30/20   Barrett, Evelene Croon, PA-C  Digestive Enzymes (DIGESTIVE ENZYME PO) Take 1 tablet by mouth in the morning and at bedtime.    [provider]  gabapentin (NEURONTIN) 300 MG capsule Take 300 mg by mouth 3 (three) times daily.    [provider]  nitroGLYCERIN (NITROSTAT) 0.4 MG SL tablet Place 1 tablet (0.4 mg total) under the tongue every 5 (five) minutes x 3 doses as needed for chest pain. 04/26/20   Theora Gianotti, NP  pantoprazole (PROTONIX) 40 MG tablet Take 1 tablet (40 mg total) by mouth daily at 6 (six) AM. 04/27/20   Theora Gianotti, NP  SYMBICORT 80-4.5 MCG/ACT inhaler Inhale into the lungs. 07/28/20   [provider]     Allergies:    Allergies  Allergen Reactions  . Amitriptyline     Made me feel strange  . Keflex [Cephalexin]     Stomach pain  . Topiramate     Made me feel strange    Social History:   Social History   Socioeconomic History  . Marital status: Married    Spouse name: Not on file  . Number of children: Not on file  . Years of education: Not on file  . Highest education level: Not on file  Occupational History  . Not on file  Tobacco Use  . Smoking status: Never Smoker  . Smokeless tobacco: Never Used  Vaping Use  . Vaping Use: Never used  Substance and Sexual Activity  . Alcohol use: Not Currently  . Drug use: Never  . Sexual activity: Yes  Other Topics Concern  . Not on file  Social History Narrative  . Not on file   Social Determinants of Health   Financial Resource Strain: Not on file  Food Insecurity: Not on file  Transportation Needs: Not on file  Physical Activity: Not on file  Stress: Not on file  Social Connections: Not on file  Intimate Partner Violence: Not on file    Family History:   The patient's family history includes  Breast cancer in his sister; Diabetes Mellitus II in his father; Heart attack in his father and maternal grandfather; Hypertension in his father; Kidney cancer in his brother; Parkinson's disease in an other family member.    ROS:  Please see the history of present illness.  General:no colds or fevers, no weight changes Skin:no rashes or ulcers HEENT:no blurred vision, no congestion CV:see HPI PUL:see HPI GI:no diarrhea constipation or melena, no indigestion GU:no hematuria, no dysuria MS:no joint pain, no claudication Neuro:no syncope, no lightheadedness Endo:no diabetes, no thyroid disease, + thyroid goiter  All other ROS reviewed and negative.     Physical Exam/Data:   Vitals:   09/16/20 1115 09/16/20 1130 09/16/20 1145 09/16/20 1200  BP:    125/75  Pulse: 63 68 79 71  Resp: 17 19 15 17   Temp:      TempSrc:      SpO2: 98% 98% 97% 98%  Weight:      Height:        Intake/Output Summary (Last 24 hours) at 09/16/2020 1210 Last data filed at 09/16/2020 1026 Gross per 24 hour  Intake 500 ml  Output --  Net 500 ml   Last 3 Weights 09/16/2020 08/10/2020 05/11/2020  Weight (lbs) 198 lb 6.6 oz 176 lb 187 lb  Weight (kg) 90 kg 79.833 kg 84.823 kg     Body mass index is 29.3 kg/m.  General:  Well nourished, well developed, in no acute distress HEENT: normal Lymph: no adenopathy Neck: no JVD Endocrine:  No thryomegaly Vascular: No carotid bruits; pedal pulses 2+ bilaterally   Cardiac:  normal S1, S2; RRR; no murmur gallup rub or click, + reproducible pain to upper left chest wall with palpation Lungs:  clear to auscultation bilaterally, no wheezing, rhonchi or rales  Abd: soft, nontender, no hepatomegaly  Ext: no lower ext edema Musculoskeletal:  No deformities, BUE and BLE strength normal and equal Skin: warm and dry  Neuro: alert and oriented X 3 MAE follows commands, no focal abnormalities noted Psych:  Normal affect   Relevant CV Studies: 04/25/20 cath and  PCI Diagnostic Dominance: Right    Intervention        IMPRESSION: Successful proximal LAD PCI and stenting using double wire technique protecting the moderate-sized diagonal branch with a long 38 mm resolute Onyx drug-eluting stent encompassing the proximal LAD, the bifurcation and the mid LAD.  The proximal LAD was postdilated with a 3.25 x 12 mm long balloon.  The remainder of his coronary anatomy is free of significant disease.  His LV function was normal by 2D echo.  His enzymes were normal as well.  Because of chest pain post procedure I am going to place him in unit 2H for closer observation.  We will put him on low-dose IV nitroglycerin for blood pressure control.  He will need uninterrupted dual antiplatelet therapy for 12 months.    Echo 04/24/20  IMPRESSIONS    1. Left ventricular ejection fraction, by estimation, is 65 to 70%. The  left ventricle has normal function. The left ventricle has no regional  wall motion abnormalities. Left ventricular diastolic parameters were  normal.  2. Right ventricular systolic function is normal. The right ventricular  size is normal.  3. The mitral valve is normal in structure. Trivial mitral valve  regurgitation.  4. The aortic valve is normal in structure. Aortic valve regurgitation is  trivial.   FINDINGS  Left Ventricle: Left ventricular ejection fraction, by estimation, is 65  to 70%. The left ventricle has normal function. The left ventricle has no  regional wall motion abnormalities. The left ventricular internal cavity  size was normal in size. There is  no left ventricular hypertrophy. Left ventricular diastolic parameters  were normal.   Right Ventricle: The right ventricular size is normal. No increase in  right ventricular wall thickness. Right ventricular systolic function is  normal.   Left Atrium: Left atrial size was normal in size.   Right Atrium: Right atrial size was normal in size.   Pericardium:  There is no evidence  of pericardial effusion.   Mitral Valve: The mitral valve is normal in structure. Trivial mitral  valve regurgitation.   Tricuspid Valve: The tricuspid valve is normal in structure. Tricuspid  valve regurgitation is trivial.   Aortic Valve: The aortic valve is normal in structure. Aortic valve  regurgitation is trivial.   Pulmonic Valve: The pulmonic valve was normal in structure. Pulmonic valve  regurgitation is not visualized.   Aorta: The aortic root is normal in size and structure.   IAS/Shunts: No atrial level shunt detected by color flow Doppler.     LEFT VENTRICLE  PLAX 2D  LVIDd:     4.30 cm Diastology  LVIDs:     2.70 cm LV e' lateral:  12.80 cm/s  LV PW:     0.80 cm LV E/e' lateral: 5.2  LV IVS:    0.90 cm LV e' medial:  8.49 cm/s  LVOT diam:   1.90 cm LV E/e' medial: 7.8  LV SV:     64  LV SV Index:  32  LVOT Area:   2.84 cm     RIGHT VENTRICLE       IVC  RV S prime:   12.00 cm/s IVC diam: 1.40 cm  TAPSE (M-mode): 1.6 cm   LEFT ATRIUM       Index    RIGHT ATRIUM      Index  LA diam:    3.70 cm 1.82 cm/m RA Area:   10.20 cm  LA Vol (A2C):  46.4 ml 22.86 ml/m RA Volume:  19.50 ml 9.61 ml/m  LA Vol (A4C):  38.7 ml 19.07 ml/m  LA Biplane Vol: 43.4 ml 21.38 ml/m  AORTIC VALVE  LVOT Vmax:  97.90 cm/s  LVOT Vmean: 60.600 cm/s  LVOT VTI:  0.226 m    AORTA  Ao Root diam: 3.40 cm  Ao Asc diam: 3.10 cm   MITRAL VALVE  MV Area (PHT): 4.49 cm  SHUNTS  MV Decel Time: 169 msec  Systemic VTI: 0.23 m  MV E velocity: 66.00 cm/s Systemic Diam: 1.90 cm  MV A velocity: 67.30 cm/s  MV E/A ratio: 0.98   Laboratory Data:  High Sensitivity Troponin:   Recent Labs  Lab 09/16/20 0651 09/16/20 0855  TROPONINIHS 3 <2      Chemistry Recent Labs  Lab 09/16/20 0651  NA 138  K 3.6  CL 101  CO2 28  GLUCOSE 104*  BUN 15  CREATININE 0.99  CALCIUM 9.4   GFRNONAA >60  ANIONGAP 9    No results for input(s): PROT, ALBUMIN, AST, ALT, ALKPHOS, BILITOT in the last 168 hours. Hematology Recent Labs  Lab 09/16/20 0651  WBC 4.8  RBC 4.91  HGB 15.0  HCT 46.5  MCV 94.7  MCH 30.5  MCHC 32.3  RDW 12.3  PLT 148*   BNPNo results for input(s): BNP, PROBNP in the last 168 hours.  DDimer No results for input(s): DDIMER in the last 168 hours.   Radiology/Studies:  DG Chest 2 View  Result Date: 09/16/2020 CLINICAL DATA:  Chest pain. EXAM: CHEST - 2 VIEW COMPARISON:  CT 08/12/2020.  04/29/2020. FINDINGS: EKG pads noted over the chest. Mediastinum and hilar structures are normal. Lungs are clear. No pleural effusion or pneumothorax. Heart size normal. Coronary artery stents. No acute bony abnormality. IMPRESSION: No acute cardiopulmonary disease. Coronary artery stents noted. Heart size normal. Electronically Signed   By: Marcello Moores  Register   On: 09/16/2020 07:23     Assessment and Plan:  1. Chest pain with neg troponin and normal EKG. No relief with NTG -does have known hx CAD with stent to LAD 04/2020 this feels more like the chest wall pain he had after the stent.  +tender to touch.  Most likely chest wall pain, reassuring with improvement with toradol. No relief with NTG 2. CAD with prior stent 3. HLD on statin  4. Hx of COVID in Oct  5. HTN controlled  Dr. Acie Fredrickson to see but most likely discharge from ER.       HEAR Score (for undifferentiated chest pain):    3     For questions or updates, please contact [place] Please consult [email or web address] for contact info under     Signed, Cecilie Kicks, NP  09/16/2020 12:10 PM    Attending Note:   The patient was seen and examined.  Agree with assessment and plan as noted above.  Changes made to the above note as needed.  Patient seen and independently examined with  Cecilie Kicks, NP.   We discussed all aspects of the encounter. I agree with the assessment and plan as stated  above.  1.   Atypical chest pain: Patient presents with atypical chest wall pain.  He is tender in his mid and upper side of his left chest.  Has been doing some physical labor that he is not used to.  Said the pain for 2 days constant and it was not relieved with the nitroglycerin.  Cardiac enzymes are negative.  His EKG is unremarkable. At this point I would diagnose this is a musculoskeletal pain.  There is no evidence of any acute coronary syndrome.  I advised him to take Motrin 400 mg to 6 mg 3 times a day for the next several days.  He might take this up to a week.  He should take Tylenol as well.  We will follow up with Dr. Radford Pax or an APP in a month or so.  2.  Coronary artery disease: Patient has a known history of coronary stenting of his LAD in July, 2021.  He continues to take Plavix and aspirin.  3.  Hypertension: Blood pressure seems to be fairly well controlled.    I have spent a total of 40 minutes with patient reviewing hospital  notes , telemetry, EKGs, labs and examining patient as well as establishing an assessment and plan that was discussed with the patient. > 50% of time was spent in direct patient care.    Thayer Headings, Brooke Bonito., MD, [facility] 09/16/2020, 1:37 PM [address]  [place] Pager [phone]

## 2020-10-05 ENCOUNTER — Ambulatory Visit: Payer: Managed Care, Other (non HMO) | Admitting: Cardiology

## 2020-10-10 ENCOUNTER — Other Ambulatory Visit: Payer: Self-pay | Admitting: *Deleted

## 2020-10-11 MED ORDER — PANTOPRAZOLE SODIUM 40 MG PO TBEC: 40.0000 mg | DELAYED_RELEASE_TABLET | Freq: Every day | ORAL | 3 refills | Status: DC

## 2020-10-11 MED ORDER — CLOPIDOGREL BISULFATE 75 MG PO TABS: 75.0000 mg | ORAL_TABLET | Freq: Every day | ORAL | 3 refills | Status: DC

## 2020-10-13 ENCOUNTER — Other Ambulatory Visit: Payer: Self-pay

## 2020-10-13 MED ORDER — AMLODIPINE BESYLATE 2.5 MG PO TABS: 2.5000 mg | ORAL_TABLET | Freq: Every day | ORAL | 3 refills | Status: DC

## 2020-10-13 MED ORDER — ATORVASTATIN CALCIUM 40 MG PO TABS: 40.0000 mg | ORAL_TABLET | Freq: Every day | ORAL | 3 refills | Status: DC

## 2020-10-17 ENCOUNTER — Other Ambulatory Visit: Payer: Self-pay

## 2020-10-17 MED ORDER — ATORVASTATIN CALCIUM 40 MG PO TABS: 40.0000 mg | ORAL_TABLET | Freq: Every day | ORAL | 3 refills | Status: DC

## 2020-10-19 MED ORDER — PANTOPRAZOLE SODIUM 40 MG PO TBEC: 40.0000 mg | DELAYED_RELEASE_TABLET | Freq: Every day | ORAL | 3 refills | Status: DC

## 2020-10-19 MED ORDER — CLOPIDOGREL BISULFATE 75 MG PO TABS: 75.0000 mg | ORAL_TABLET | Freq: Every day | ORAL | 3 refills | Status: DC

## 2020-10-19 NOTE — Telephone Encounter (Signed)
Pt's medications were resent to local pharmacy. Confirmation received.

## 2020-10-19 NOTE — Addendum Note (Signed)
Addended by: Carter Kitten D on: 10/19/2020 10:16 AM   Modules accepted: Orders

## 2020-10-20 ENCOUNTER — Encounter: Payer: Self-pay | Admitting: Cardiology

## 2020-10-20 ENCOUNTER — Ambulatory Visit: Payer: Managed Care, Other (non HMO) | Admitting: Cardiology

## 2020-10-20 ENCOUNTER — Other Ambulatory Visit: Payer: Self-pay

## 2020-10-20 VITALS — BP 132/78 | HR 78 | Ht 69.0 in | Wt 181.0 lb

## 2020-10-20 DIAGNOSIS — E7800 Pure hypercholesterolemia, unspecified: Secondary | ICD-10-CM | POA: Diagnosis not present

## 2020-10-20 DIAGNOSIS — I25118 Atherosclerotic heart disease of native coronary artery with other forms of angina pectoris: Secondary | ICD-10-CM | POA: Diagnosis not present

## 2020-10-20 DIAGNOSIS — I10 Essential (primary) hypertension: Secondary | ICD-10-CM | POA: Diagnosis not present

## 2020-10-20 NOTE — Patient Instructions (Signed)

## 2020-10-20 NOTE — Progress Notes (Signed)
Date:  10/20/2020   ID:  Ronald Tapia, DOB 07-Sep-1956, MRN [id number] The patient was identified using 2 identifiers.   PCP:  Isabella Bowens, PA-C  Cardiologist:  Armanda Magic, MD  Electrophysiologist:  None   Evaluation Performed:  Follow-Up Visit  Chief Complaint:  CAD, HTN, HLD  History of Present Illness:    Ronald Tapia is a 65 y.o. male with with history of CAD (s/p PCI of the LAD on 04/25/2020), lymphoma, 2-year history of debilitating headaches ultimately found to be due to CSF leak s/p repair at [facility], GERD, borderline hypertension, hyperlipidemia, & aortic atherosclerosis.  He was admitted for chest pain in July 2021. Echo showed normal LV function without any significant valvular abnormalities. Coronary CT angiography showed possible significant stenosis in the mid LAD with an FFR of 0.72 in the mid vessel and 0.61 in the distal vessel and aortic atherosclerosis. Cath showed severe 90% proximal to mid LAD stenosis and 75% mid LAD stenosis, coronaries otherwise normal. He underwent successful PCI and stenting of the LAD. He was readmitted 7/23-7/24/21 with recurrent chest pain without significant change with NTG. There was an area just above and lateral to his left breast which was slightly tender. His workup was benign with normal sed rate, normal EKG, and completely normal enzymes. His P2Y12 was 71 indicating adequate Plavix response. He was continued on home gabapentin and started on Imdur/Voltaren empirically.  He was seen back in clinic by Ronie Spies, PA in August and was doing well. He was walking regularly and was up to 12 minutes twice daily. He would still notice occasional chest discomfort on that same side, worse upon waking up in the morning, specifically on the side he sleeps on and actually would feel better when he walked. He stopped Imdur due to headaches and lack of clinical benefit.   His atorvastatin was reduced due to possible SE of parasthesias,  sluggishness and cold intolerance.  Amlodipine low dose was added for better BP control.    He was back in the hospital in Dec in the ER with atypical CP reproducible with chest wall palpation and felt to be MSK and resolved.    He is here today for followup and is doing well.  He denies any anginal chest pain or pressure, SOB, DOE, PND, orthopnea, LE edema, dizziness, palpitations or syncope. He is compliant with his meds and is tolerating meds with no SE.    Past Medical History:  Diagnosis Date  . Anxiety   . Aortic atherosclerosis ([facility])   . CAD (coronary artery disease)    a. 04/2020 Cor CTA: mid LAD dzs (FFR 0.43m, 0.61d); b. 04/2020 PCI: LM nl, LAD 90p/m, 64m (2.75x49mm Resolute Onyx DES), LCX nl, RCA nl.  . Cancer ([facility])   . 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 Depression   . GERD (gastroesophageal reflux disease) 04/24/2020  . Headache   . History of echocardiogram    a. 04/2020 Echo: EF 65-70%, no rwma, triv MR/AI.  Marland Kitchen Hyperlipidemia 04/24/2020  . Lymphoma ([facility])   . White coat syndrome without hypertension    Past Surgical History:  Procedure Laterality Date  . BACK SURGERY    . CORONARY STENT INTERVENTION N/A 04/25/2020   Procedure: CORONARY STENT INTERVENTION;  Surgeon: Runell Gess, MD;  Location: [facility];  Service: Cardiovascular;  Laterality: N/A;  . LEFT HEART CATH AND CORONARY ANGIOGRAPHY N/A 04/25/2020   Procedure:  LEFT HEART CATH AND CORONARY ANGIOGRAPHY;  Surgeon: Lorretta Harp, MD;  Location: [place] [facility];  Service: Cardiovascular;  Laterality: N/A;     Current Meds  Medication Sig  . acetaminophen (TYLENOL) 650 MG CR tablet Take 1,300 mg by mouth 2 (two) times daily as needed for pain.  Marland Kitchen amLODipine (NORVASC) 2.5 MG tablet Take 1 tablet (2.5 mg total) by mouth daily.  Marland Kitchen ascorbic acid (VITAMIN C) 500 MG tablet Take 500 mg by mouth 2 (two) times daily.  Marland Kitchen aspirin EC 81 MG tablet Take 81 mg by mouth daily.  Swallow whole.  Marland Kitchen atorvastatin (LIPITOR) 40 MG tablet Take 1 tablet (40 mg total) by mouth daily.  . Cholecalciferol (VITAMIN D3 PO) Take 7,500 Units by mouth every evening. 75000IU  . clopidogrel (PLAVIX) 75 MG tablet Take 1 tablet (75 mg total) by mouth daily with breakfast.  . Digestive Enzymes (DIGESTIVE ENZYME PO) Take 1 tablet by mouth 2 (two) times daily.  . fluticasone (FLONASE) 50 MCG/ACT nasal spray Place 1 spray into both nostrils 2 (two) times daily as needed for rhinitis (congestion).  . gabapentin (NEURONTIN) 300 MG capsule Take 600 mg by mouth 3 (three) times daily.  . Melatonin 10 MG TABS Take 10 mg by mouth at bedtime.  . nitroGLYCERIN (NITROSTAT) 0.4 MG SL tablet Place 1 tablet (0.4 mg total) under the tongue every 5 (five) minutes x 3 doses as needed for chest pain.  Marland Kitchen OVER THE COUNTER MEDICATION Take 1,000 mg by mouth 3 (three) times daily. Beet Root  . pantoprazole (PROTONIX) 40 MG tablet Take 1 tablet (40 mg total) by mouth daily at 6 (six) AM.     Allergies:   Imdur [isosorbide nitrate], Amitriptyline, Keflex [cephalexin], and Topiramate   Social History   Tobacco Use  . Smoking status: Never Smoker  . Smokeless tobacco: Never Used  Vaping Use  . Vaping Use: Never used  Substance Use Topics  . Alcohol use: Not Currently  . Drug use: Never     Family Hx: The patient's family history includes Breast cancer in his sister; Diabetes Mellitus II in his father; Heart attack in his father and maternal grandfather; Hypertension in his father; Kidney cancer in his brother; Parkinson's disease in an other family member.  ROS:   Please see the history of present illness.    none All other systems reviewed and are negative.   Prior CV studies:   The following studies were reviewed today:  EKG  Labs/Other Tests and Data Reviewed:    EKG:  Not done today  Recent Labs: 05/11/2020: Magnesium 2.1; TSH 2.190 06/28/2020: ALT 24 09/16/2020: BUN 15; Creatinine, Ser 0.99;  Hemoglobin 15.0; Platelets 148; Potassium 3.6; Sodium 138   Recent Lipid Panel Lab Results  Component Value Date/Time   CHOL 117 06/28/2020 08:39 AM   TRIG 52 06/28/2020 08:39 AM   HDL 40 06/28/2020 08:39 AM   CHOLHDL 2.9 06/28/2020 08:39 AM   CHOLHDL 5.9 04/24/2020 05:32 AM   LDLCALC 65 06/28/2020 08:39 AM    Wt Readings from Last 3 Encounters:  10/20/20 181 lb (82.1 kg)  09/16/20 198 lb 6.6 oz (90 kg)  08/10/20 176 lb (79.8 kg)      Objective:    Vital Signs:  BP 132/78   Pulse 78   Ht 5\' 9"  (1.753 m)   Wt 181 lb (82.1 kg)   SpO2 97%   BMI 26.73 kg/m   GEN: Well nourished, well developed in no  acute distress HEENT: Normal NECK: No JVD; No carotid bruits LYMPHATICS: No lymphadenopathy CARDIAC:RRR, no murmurs, rubs, gallops RESPIRATORY:  Clear to auscultation without rales, wheezing or rhonchi  ABDOMEN: Soft, non-tender, non-distended MUSCULOSKELETAL:  No edema; No deformity  SKIN: Warm and dry NEUROLOGIC:  Alert and oriented x 3 PSYCHIATRIC:  Normal affect    ASSESSMENT & PLAN:    1.  ASCAD -Coronary CT angiography showed possible significant stenosis in the mid LAD with an FFR of 0.72 in the mid vessel and 0.61 in the distal vessel and aortic atherosclerosis.  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 He underwent successful PCI and stenting of the LAD. -continued to have chronic CP with negative workup after PCI and felt to possibly be related to possible GERD/MSK -he has had problems with chest wall pain intermittently since his PCI but that seems to have resolved since last month -chest pain has completely resolved and he has not had any further angina since I saw him last -continue ASA 81mg  daily, Plavix 75mg  daily and statin  2.  HTN -BP is well controlled on exam today -continue amlodipine 2.5mg  daily  3.  HLD -LDL goal < 70 -LDL was 65 on 06/28/2020 -his joint pain has improved on  lower dose of statin -continue Atorvastatin 40mg  daily  Tests Ordered: No orders of the defined types were placed in this encounter.   Medication Changes: No orders of the defined types were placed in this encounter.   Follow Up:  In Person in 6 month(s)  Signed, Fransico Him, MD  10/20/2020 3:47 PM    [place]

## 2020-10-27 ENCOUNTER — Ambulatory Visit: Payer: Managed Care, Other (non HMO) | Admitting: Cardiology

## 2020-11-24 ENCOUNTER — Emergency Department (HOSPITAL_COMMUNITY)
Admission: EM | Admit: 2020-11-24 | Discharge: 2020-11-24 | Disposition: A | Discharge status: Home / Self Care | Payer: Managed Care, Other (non HMO) | Attending: Emergency Medicine | Admitting: Emergency Medicine

## 2020-11-24 ENCOUNTER — Emergency Department (HOSPITAL_COMMUNITY): Payer: Managed Care, Other (non HMO)

## 2020-11-24 ENCOUNTER — Other Ambulatory Visit: Payer: Self-pay

## 2020-11-24 ENCOUNTER — Encounter (HOSPITAL_COMMUNITY): Payer: Self-pay | Admitting: Emergency Medicine

## 2020-11-24 DIAGNOSIS — I2510 Atherosclerotic heart disease of native coronary artery without angina pectoris: Secondary | ICD-10-CM | POA: Diagnosis not present

## 2020-11-24 DIAGNOSIS — R42 Dizziness and giddiness: Secondary | ICD-10-CM | POA: Insufficient documentation

## 2020-11-24 DIAGNOSIS — R110 Nausea: Secondary | ICD-10-CM | POA: Diagnosis not present

## 2020-11-24 DIAGNOSIS — Z859 Personal history of malignant neoplasm, unspecified: Secondary | ICD-10-CM | POA: Insufficient documentation

## 2020-11-24 DIAGNOSIS — R079 Chest pain, unspecified: Secondary | ICD-10-CM | POA: Diagnosis not present

## 2020-11-24 DIAGNOSIS — Z7982 Long term (current) use of aspirin: Secondary | ICD-10-CM | POA: Insufficient documentation

## 2020-11-24 DIAGNOSIS — I10 Essential (primary) hypertension: Secondary | ICD-10-CM | POA: Insufficient documentation

## 2020-11-24 DIAGNOSIS — R55 Syncope and collapse: Secondary | ICD-10-CM | POA: Diagnosis not present

## 2020-11-24 DIAGNOSIS — Z7902 Long term (current) use of antithrombotics/antiplatelets: Secondary | ICD-10-CM | POA: Insufficient documentation

## 2020-11-24 DIAGNOSIS — Z79899 Other long term (current) drug therapy: Secondary | ICD-10-CM | POA: Diagnosis not present

## 2020-11-24 LAB — URINALYSIS, ROUTINE W REFLEX MICROSCOPIC
Bilirubin Urine: NEGATIVE
Glucose, UA: NEGATIVE mg/dL
Hgb urine dipstick: NEGATIVE
Ketones, ur: NEGATIVE mg/dL
Leukocytes,Ua: NEGATIVE
Nitrite: NEGATIVE
Protein, ur: NEGATIVE mg/dL
Specific Gravity, Urine: 1.01 (ref 1.005–1.030)
pH: 7 (ref 5.0–8.0)

## 2020-11-24 LAB — BASIC METABOLIC PANEL
Anion gap: 8 (ref 5–15)
BUN: 14 mg/dL (ref 8–23)
CO2: 27 mmol/L (ref 22–32)
Calcium: 9.4 mg/dL (ref 8.9–10.3)
Chloride: 105 mmol/L (ref 98–111)
Creatinine, Ser: 0.95 mg/dL (ref 0.61–1.24)
GFR, Estimated: >60 mL/min (ref >60)
Glucose, Bld: 101 mg/dL — ABNORMAL HIGH (ref 70–99)
Potassium: 3.8 mmol/L (ref 3.5–5.1)
Sodium: 140 mmol/L (ref 135–145)

## 2020-11-24 LAB — CBC
HCT: 47.7 % (ref 39.0–52.0)
Hemoglobin: 15.7 g/dL (ref 13.0–17.0)
MCH: 31.2 pg (ref 26.0–34.0)
MCHC: 32.9 g/dL (ref 30.0–36.0)
MCV: 94.6 fL (ref 80.0–100.0)
Platelets: 147 10*3/uL — ABNORMAL LOW (ref 150–400)
RBC: 5.04 MIL/uL (ref 4.22–5.81)
RDW: 12.5 % (ref 11.5–15.5)
WBC: 5.7 10*3/uL (ref 4.0–10.5)
nRBC: 0 % (ref 0.0–0.2)

## 2020-11-24 LAB — TROPONIN I (HIGH SENSITIVITY)
Troponin I (High Sensitivity): 4 ng/L (ref <18)
Troponin I (High Sensitivity): 3 ng/L (ref <18)

## 2020-11-24 LAB — CBG MONITORING, ED: Glucose-Capillary: 105 mg/dL — ABNORMAL HIGH (ref 70–99)

## 2020-11-24 MED ORDER — SODIUM CHLORIDE 0.9 % IV BOLUS
1000.0000 mL | Freq: Once | INTRAVENOUS | Status: AC
  Administered 2020-11-24: 1000 mL via INTRAVENOUS

## 2020-11-24 NOTE — ED Provider Notes (Signed)
[facility] EMERGENCY DEPARTMENT Provider Note   CSN: [id number] Arrival date & time: 11/24/20  1032     History Chief Complaint  Patient presents with  . Near Syncope    Ronald Tapia is a 65 y.o. male.  Patient is a 65 year old male with a history of coronary artery disease, higher lymphoma, GERD, hyperlipidemia and hypertension who presents with a near syncopal event.  He states that he was feeling good earlier this morning around 5:15 AM he was in the shower and started feeling dizzy and lightheaded.  He felt like he was going to pass out.  He did not completely pass out.  He denies any palpitations or feeling like his heart was racing during this time.  He did get nauseated after and he still has some ongoing lightheadedness.  He denies any headache.  He said shortly after that time he started having some intense gas pains and during that time he started having some pain in his left chest going a little bit to his left arm.  He says it is eased off he still has a little bit of chest discomfort.  No ongoing abdominal pain.  No diarrhea.  He denies any headache.  No recent illnesses.  No cough or cold symptoms.  He says been eating and drinking okay.        Past Medical History:  Diagnosis Date  . Anxiety   . Aortic atherosclerosis ([place])   . CAD (coronary artery disease)    a. 04/2020 Cor CTA: mid LAD dzs (FFR 0.75m, 0.61d); b. 04/2020 PCI: LM nl, LAD 90p/m, 51m (2.75x37mm Resolute Onyx DES), LCX nl, RCA nl.  . Cancer ([place])   . 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 Depression   . GERD (gastroesophageal reflux disease) 04/24/2020  . Headache   . History of echocardiogram    a. 04/2020 Echo: EF 65-70%, no rwma, triv MR/AI.  Marland Kitchen Hyperlipidemia 04/24/2020  . Lymphoma ([place])   . White coat syndrome without hypertension     Patient Active Problem List   Diagnosis Date Noted  . Chest pain 04/30/2020  . Coronary artery disease of  native artery of native heart with stable angina pectoris ([place])   . Essential hypertension   . Chronic headaches 04/25/2020  . Chest pain of uncertain etiology 68/09/7516  . GERD (gastroesophageal reflux disease) 04/24/2020  . Hyperlipidemia 04/24/2020  . Midsternal chest pain 04/23/2020    Past Surgical History:  Procedure Laterality Date  . BACK SURGERY    . CORONARY STENT INTERVENTION N/A 04/25/2020   Procedure: CORONARY STENT INTERVENTION;  Surgeon: Lorretta Harp, MD;  Location: [facility];  Service: Cardiovascular;  Laterality: N/A;  . LEFT HEART CATH AND CORONARY ANGIOGRAPHY N/A 04/25/2020   Procedure: LEFT HEART CATH AND CORONARY ANGIOGRAPHY;  Surgeon: Lorretta Harp, MD;  Location: [facility];  Service: Cardiovascular;  Laterality: N/A;       Family History  Problem Relation Age of Onset  . Heart attack Father        died in his sleep, unclear if heart attack or stroke  . Hypertension Father   . Diabetes Mellitus II Father   . Breast cancer Sister   . Kidney cancer Brother   . Parkinson's disease Other   . Heart attack Maternal Grandfather        occurs in his 38s    Social History   Tobacco  Use  . Smoking status: Never Smoker  . Smokeless tobacco: Never Used  Vaping Use  . Vaping Use: Never used  Substance Use Topics  . Alcohol use: Not Currently  . Drug use: Never    Home Medications Prior to Admission medications   Medication Sig Start Date End Date Taking? Authorizing Provider  acetaminophen (TYLENOL) 650 MG CR tablet Take 1,300 mg by mouth 2 (two) times daily as needed for pain.    [provider]  amLODipine (NORVASC) 2.5 MG tablet Take 1 tablet (2.5 mg total) by mouth daily. 10/13/20   Sueanne Margarita, MD  ascorbic acid (VITAMIN C) 500 MG tablet Take 500 mg by mouth 2 (two) times daily.    [provider]  aspirin EC 81 MG tablet Take 81 mg by mouth daily. Swallow whole.    [provider]  atorvastatin  (LIPITOR) 40 MG tablet Take 1 tablet (40 mg total) by mouth daily. 10/17/20   Sueanne Margarita, MD  Cholecalciferol (VITAMIN D3 PO) Take 7,500 Units by mouth every evening. 75000IU    [provider]  clopidogrel (PLAVIX) 75 MG tablet Take 1 tablet (75 mg total) by mouth daily with breakfast. 10/19/20   Sueanne Margarita, MD  Digestive Enzymes (DIGESTIVE ENZYME PO) Take 1 tablet by mouth 2 (two) times daily.    [provider]  fluticasone (FLONASE) 50 MCG/ACT nasal spray Place 1 spray into both nostrils 2 (two) times daily as needed for rhinitis (congestion).    [provider]  gabapentin (NEURONTIN) 300 MG capsule Take 600 mg by mouth 3 (three) times daily.    [provider]  Melatonin 10 MG TABS Take 10 mg by mouth at bedtime.    [provider]  nitroGLYCERIN (NITROSTAT) 0.4 MG SL tablet Place 1 tablet (0.4 mg total) under the tongue every 5 (five) minutes x 3 doses as needed for chest pain. 04/26/20   Theora Gianotti, NP  OVER THE COUNTER MEDICATION Take 1,000 mg by mouth 3 (three) times daily. Beet Root    [provider]  pantoprazole (PROTONIX) 40 MG tablet Take 1 tablet (40 mg total) by mouth daily at 6 (six) AM. 10/19/20   Sueanne Margarita, MD    Allergies    Imdur [isosorbide nitrate], Amitriptyline, Keflex [cephalexin], and Topiramate  Review of Systems   Review of Systems  Constitutional: Negative for chills, diaphoresis, fatigue and fever.  HENT: Negative for congestion, rhinorrhea and sneezing.   Eyes: Negative.   Respiratory: Positive for shortness of breath. Negative for cough and chest tightness.   Cardiovascular: Positive for chest pain. Negative for leg swelling.  Gastrointestinal: Positive for abdominal pain and nausea. Negative for blood in stool, diarrhea and vomiting.  Genitourinary: Negative for difficulty urinating, flank pain, frequency and hematuria.  Musculoskeletal: Negative for arthralgias and back pain.   Skin: Negative for rash.  Neurological: Positive for light-headedness. Negative for dizziness, speech difficulty, weakness, numbness and headaches.    Physical Exam Updated Vital Signs BP 115/74   Pulse 70   Temp 98.2 F (36.8 C) (Oral)   Resp 12   SpO2 96%   Physical Exam Constitutional:      Appearance: He is well-developed and well-nourished.  HENT:     Head: Normocephalic and atraumatic.  Eyes:     Pupils: Pupils are equal, round, and reactive to light.  Cardiovascular:     Rate and Rhythm: Normal rate and regular rhythm.     Heart  sounds: Normal heart sounds.  Pulmonary:     Effort: Pulmonary effort is normal. No respiratory distress.     Breath sounds: Normal breath sounds. No wheezing or rales.  Chest:     Chest wall: No tenderness.  Abdominal:     General: Bowel sounds are normal.     Palpations: Abdomen is soft.     Tenderness: There is no abdominal tenderness. There is no guarding or rebound.  Musculoskeletal:        General: No edema. Normal range of motion.     Cervical back: Normal range of motion and neck supple.  Lymphadenopathy:     Cervical: No cervical adenopathy.  Skin:    General: Skin is warm and dry.     Findings: No rash.  Neurological:     Mental Status: He is alert and oriented to person, place, and time.     Comments: Motor 5/5 all extremities Sensation grossly intact to LT all extremities Finger to Nose intact, no pronator drift CN II-XII grossly intact Gait normal   Psychiatric:        Mood and Affect: Mood and affect normal.     ED Results / Procedures / Treatments   Labs (all labs ordered are listed, but only abnormal results are displayed) Labs Reviewed  BASIC METABOLIC PANEL - Abnormal; Notable for the following components:      Result Value   Glucose, Bld 101 (*)    All other components within normal limits  CBC - Abnormal; Notable for the following components:   Platelets 147 (*)    All other components within normal  limits  CBG MONITORING, ED - Abnormal; Notable for the following components:   Glucose-Capillary 105 (*)    All other components within normal limits  URINALYSIS, ROUTINE W REFLEX MICROSCOPIC  TROPONIN I (HIGH SENSITIVITY)  TROPONIN I (HIGH SENSITIVITY)    EKG EKG Interpretation  Date/Time:  Thursday November 24 2020 10:44:21 EST Ventricular Rate:  84 PR Interval:  180 QRS Duration: 72 QT Interval:  328 QTC Calculation: 387 R Axis:   74 Text Interpretation: Normal sinus rhythm Normal ECG since last tracing no significant change Confirmed by Malvin Johns [phone]) on 11/24/2020 11:36:32 AM   Radiology DG Chest 2 View  Result Date: 11/24/2020 CLINICAL DATA:  Chest pain. EXAM: CHEST - 2 VIEW COMPARISON:  September 16, 2020. FINDINGS: The heart size and mediastinal contours are within normal limits. Coronary artery stent. Both lungs are clear. No visible pleural effusions or pneumothorax. No acute osseous abnormality. IMPRESSION: No active cardiopulmonary disease. Electronically Signed   By: Margaretha Sheffield MD   On: 11/24/2020 12:31    Procedures Procedures   Medications Ordered in ED Medications  sodium chloride 0.9 % bolus 1,000 mL (0 mLs Intravenous Stopped 11/24/20 1443)    ED Course  I have reviewed the triage vital signs and the nursing notes.  Pertinent labs & imaging results that were available during my care of the patient were reviewed by me and considered in my medical decision making (see chart for details).    MDM Rules/Calculators/A&P                          Patient presents with a near syncopal event while in the shower.  He had some resulting chest pain after that which started while he was having some gas pains in his abdomen.  He is currently not having any symptoms.  He does  not have any abdominal tenderness on exam.  He does not have any ongoing chest pain or shortness of breath.  His EKG does not show any ischemic findings.  His labs are nonconcerning.   He has had 2 - troponins.  He is neurologically intact.  He was given some IV fluids and does not have any ongoing dizziness.  He is able to ambulate without ataxia or dizziness.  No arrhythmias were noted and he did not have symptoms that would be more concerning for arrhythmias.  He was discharged home in good condition.  He was encouraged to have follow-up with his PCP.  Return precautions were given. Final Clinical Impression(s) / ED Diagnoses Final diagnoses:  Near syncope    Rx / DC Orders ED Discharge Orders    None       Malvin Johns, MD 11/24/20 1524

## 2020-11-24 NOTE — ED Triage Notes (Signed)
Patient here for evaluation after feeling like he almost passed out during a shower earlier today at 0515 this morning, patient reports continued but improved dizziness, denies nausea, reports "slight" pain in left arm and left side of chest.

## 2020-11-24 NOTE — Discharge Instructions (Signed)
Make a follow-up appointment with your primary care doctor.  Return here as needed if you have any worsening symptoms.

## 2020-11-24 NOTE — ED Notes (Signed)
Went to discharge pt, unable to locate pt.

## 2020-11-24 NOTE — ED Notes (Signed)
Patient given discharge instructions. Questions were answered. Patient verbalized understanding of discharge instructions and care at home.  Pt discharged with wife.

## 2020-11-24 NOTE — ED Notes (Signed)
Pt ambulated in hall with no c/o of weakness. States that he does feel better post iv fluids

## 2020-11-25 ENCOUNTER — Encounter: Payer: Self-pay | Admitting: Radiology

## 2020-11-25 DIAGNOSIS — R55 Syncope and collapse: Secondary | ICD-10-CM

## 2020-11-25 NOTE — Progress Notes (Signed)
Enrolled patient for a 30 day [employer] event Monitor to be mailed to patients home.

## 2020-12-03 ENCOUNTER — Ambulatory Visit (INDEPENDENT_AMBULATORY_CARE_PROVIDER_SITE_OTHER): Payer: Managed Care, Other (non HMO)

## 2020-12-03 DIAGNOSIS — R55 Syncope and collapse: Secondary | ICD-10-CM | POA: Diagnosis not present

## 2020-12-12 ENCOUNTER — Other Ambulatory Visit: Payer: Self-pay | Admitting: *Deleted

## 2020-12-12 MED ORDER — NITROGLYCERIN 0.4 MG SL SUBL: 0.4000 mg | SUBLINGUAL_TABLET | SUBLINGUAL | 3 refills | Status: AC | PRN

## 2020-12-29 ENCOUNTER — Ambulatory Visit: Payer: Managed Care, Other (non HMO) | Admitting: Cardiology

## 2021-01-08 ENCOUNTER — Emergency Department (HOSPITAL_BASED_OUTPATIENT_CLINIC_OR_DEPARTMENT_OTHER)
Admission: EM | Admit: 2021-01-08 | Discharge: 2021-01-08 | Disposition: A | Discharge status: Home / Self Care | Payer: Managed Care, Other (non HMO) | Attending: Emergency Medicine | Admitting: Emergency Medicine

## 2021-01-08 ENCOUNTER — Other Ambulatory Visit: Payer: Self-pay

## 2021-01-08 ENCOUNTER — Encounter (HOSPITAL_BASED_OUTPATIENT_CLINIC_OR_DEPARTMENT_OTHER): Payer: Self-pay | Admitting: *Deleted

## 2021-01-08 DIAGNOSIS — Z20822 Contact with and (suspected) exposure to covid-19: Secondary | ICD-10-CM | POA: Diagnosis not present

## 2021-01-08 DIAGNOSIS — I2510 Atherosclerotic heart disease of native coronary artery without angina pectoris: Secondary | ICD-10-CM | POA: Diagnosis not present

## 2021-01-08 DIAGNOSIS — Z79899 Other long term (current) drug therapy: Secondary | ICD-10-CM | POA: Diagnosis not present

## 2021-01-08 DIAGNOSIS — I10 Essential (primary) hypertension: Secondary | ICD-10-CM | POA: Insufficient documentation

## 2021-01-08 DIAGNOSIS — R509 Fever, unspecified: Secondary | ICD-10-CM | POA: Diagnosis present

## 2021-01-08 DIAGNOSIS — B349 Viral infection, unspecified: Secondary | ICD-10-CM | POA: Diagnosis not present

## 2021-01-08 DIAGNOSIS — Z859 Personal history of malignant neoplasm, unspecified: Secondary | ICD-10-CM | POA: Diagnosis not present

## 2021-01-08 DIAGNOSIS — D72819 Decreased white blood cell count, unspecified: Secondary | ICD-10-CM | POA: Insufficient documentation

## 2021-01-08 DIAGNOSIS — Z7982 Long term (current) use of aspirin: Secondary | ICD-10-CM | POA: Diagnosis not present

## 2021-01-08 LAB — COMPREHENSIVE METABOLIC PANEL
ALT: 28 U/L (ref 0–44)
AST: 24 U/L (ref 15–41)
Albumin: 3.1 g/dL — ABNORMAL LOW (ref 3.5–5.0)
Alkaline Phosphatase: 38 U/L (ref 38–126)
Anion gap: 7 (ref 5–15)
BUN: 15 mg/dL (ref 8–23)
CO2: 24 mmol/L (ref 22–32)
Calcium: 8.3 mg/dL — ABNORMAL LOW (ref 8.9–10.3)
Chloride: 101 mmol/L (ref 98–111)
Creatinine, Ser: 0.79 mg/dL (ref 0.61–1.24)
GFR, Estimated: >60 mL/min (ref >60)
Glucose, Bld: 94 mg/dL (ref 70–99)
Potassium: 3.4 mmol/L — ABNORMAL LOW (ref 3.5–5.1)
Sodium: 132 mmol/L — ABNORMAL LOW (ref 135–145)
Total Bilirubin: 0.6 mg/dL (ref 0.3–1.2)
Total Protein: 6.4 g/dL — ABNORMAL LOW (ref 6.5–8.1)

## 2021-01-08 LAB — URINALYSIS, ROUTINE W REFLEX MICROSCOPIC
Bilirubin Urine: NEGATIVE
Glucose, UA: NEGATIVE mg/dL
Hgb urine dipstick: NEGATIVE
Ketones, ur: NEGATIVE mg/dL
Leukocytes,Ua: NEGATIVE
Nitrite: NEGATIVE
Protein, ur: NEGATIVE mg/dL
Specific Gravity, Urine: 1.01 (ref 1.005–1.030)
pH: 6 (ref 5.0–8.0)

## 2021-01-08 LAB — CBC WITH DIFFERENTIAL/PLATELET
Abs Immature Granulocytes: 0.01 10*3/uL (ref 0.00–0.07)
Basophils Absolute: 0 10*3/uL (ref 0.0–0.1)
Basophils Relative: 0 %
Eosinophils Absolute: 0 10*3/uL (ref 0.0–0.5)
Eosinophils Relative: 1 %
HCT: 41.9 % (ref 39.0–52.0)
Hemoglobin: 14.5 g/dL (ref 13.0–17.0)
Immature Granulocytes: 0 %
Lymphocytes Relative: 18 %
Lymphs Abs: 0.7 10*3/uL (ref 0.7–4.0)
MCH: 31.9 pg (ref 26.0–34.0)
MCHC: 34.6 g/dL (ref 30.0–36.0)
MCV: 92.3 fL (ref 80.0–100.0)
Monocytes Absolute: 0.6 10*3/uL (ref 0.1–1.0)
Monocytes Relative: 15 %
Neutro Abs: 2.5 10*3/uL (ref 1.7–7.7)
Neutrophils Relative %: 66 %
Platelets: 122 10*3/uL — ABNORMAL LOW (ref 150–400)
RBC: 4.54 MIL/uL (ref 4.22–5.81)
RDW: 12.7 % (ref 11.5–15.5)
WBC: 3.8 10*3/uL — ABNORMAL LOW (ref 4.0–10.5)
nRBC: 0 % (ref 0.0–0.2)

## 2021-01-08 LAB — RESP PANEL BY RT-PCR (FLU A&B, COVID) ARPGX2
Influenza A by PCR: NEGATIVE
Influenza B by PCR: NEGATIVE
SARS Coronavirus 2 by RT PCR: NEGATIVE

## 2021-01-08 LAB — LIPASE, BLOOD: Lipase: 28 U/L (ref 11–51)

## 2021-01-08 MED ORDER — SUCRALFATE 1 G PO TABS: 1.0000 g | ORAL_TABLET | Freq: Three times a day (TID) | ORAL | 0 refills | Status: DC

## 2021-01-08 NOTE — ED Provider Notes (Signed)
MSE was initiated and I personally evaluated the patient and placed orders (if any) at  10:14 AM on January 08, 2021.  The patient appears stable so that the remainder of the MSE may be completed by another provider.  65 year old male with past medical history significant for lymphoma no longer on chemotherapy and CAD s/p stent placement presented to the ED with a 3-day history of body aches, fevers with T-max of 100 F, and nausea symptoms worse after eating.  He points to his right upper quadrant when describing abdominal discomfort.   Corena Herter, PA-C 01/08/21 1014    Margette Fast, MD 01/10/21 1002

## 2021-01-08 NOTE — Discharge Instructions (Signed)
Your abdominal symptoms are nonspecific.  Many cases can be observed and treated at home after initial evaluation in the emergency department. Even though you are being discharged home, abdominal pain can be unpredictable. Therefore, you need a repeat exam if your pain does not resolve, if it returns, or worsens. Most patient's with abdominal pain do not need to be admitted to the hospital or have surgery, but serious problems like appendicitis and gallbladder attacks can start out as nonspecific pain. Many abdominal conditions cannot be diagnosed in one visit, so follow-up evaluations are very important.  Please seek immediate care if you have any develop any of the following symptoms: The pain does not improve or gets worse.  You develop a fever or chills.  You keep throwing up (vomiting).  The pain is felt only in portions of the abdomen.  You pass bloody or black tarry stools.   You do not have a bowel movement for more than 4 days.  You have profuse (greater than 4/day), loose stools for more than 4 days.   Please follow-up with your primary care provider regarding today's ED encounter for ongoing evaluation and management.  You had mildly downtrending white blood cell count and red blood cell count.  Your platelet count has also been mildly downtrending.  You also had mild electrolyte derangement.  Please have repeat labs with your PCP in the next 3 to 4 days.  As for reflux symptoms, please continue to take the Protonix, as prescribed.  I would also like you to take your abortive therapies such as Maalox and Tums as needed.  Please take Carafate, as prescribed, as well.  Return to the ED or seek immediate medical attention should you experience any new or worsening symptoms.

## 2021-01-08 NOTE — ED Triage Notes (Signed)
Pt reports nausea, fever, abdominal cramping-worsening after eating x 3 days.  Pt pale and in obvious pain in triage.

## 2021-01-08 NOTE — ED Provider Notes (Addendum)
[facility] EMERGENCY DEPARTMENT Provider Note   CSN: [id number] Arrival date & time: 01/08/21  7322     History Chief Complaint  Patient presents with  . Fever    Ronald Tapia is a 65 y.o. male with past medical history significant for lymphoma in remission and no longer on chemotherapy, GERD, and CAD s/p stent placement on Plavix who presents to the ED with a 3-day history of body aches, nausea and abdominal pain, and fevers with T-max of 100 F.  Patient reports that for the past 3 days she has been experiencing increased reflux disease and indigestion.  While he notes taking Protonix 40 mg daily for years, he does not typically experience heartburn symptoms and has had to take Tums and Pepto-Bismol multiple times this week.  He suspects that it could be a viral GI illness.  However, he wanted to come to the ED for evaluation.  His wife is at bedside and she denies any personal illness.  He denies any other obvious sick contacts.  Patient denies any chest pain, shortness of breath, cough or congestion, vomiting, dysuria or increased urinary frequency, obvious hematuria, diarrhea, or other changes in his bowel habits.   HPI     Past Medical History:  Diagnosis Date  . Anxiety   . Aortic atherosclerosis ([place])   . CAD (coronary artery disease)    a. 04/2020 Cor CTA: mid LAD dzs (FFR 0.70m, 0.61d); b. 04/2020 PCI: LM nl, LAD 90p/m, 46m (2.75x2mm Resolute Onyx DES), LCX nl, RCA nl.  . Cancer ([place])   . 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 Depression   . GERD (gastroesophageal reflux disease) 04/24/2020  . Headache   . History of echocardiogram    a. 04/2020 Echo: EF 65-70%, no rwma, triv MR/AI.  Marland Kitchen Hyperlipidemia 04/24/2020  . Lymphoma ([place])   . White coat syndrome without hypertension     Patient Active Problem List   Diagnosis Date Noted  . Chest pain 04/30/2020  . Coronary artery disease of native artery of native heart with  stable angina pectoris ([place])   . Essential hypertension   . Chronic headaches 04/25/2020  . Chest pain of uncertain etiology 02/54/2706  . GERD (gastroesophageal reflux disease) 04/24/2020  . Hyperlipidemia 04/24/2020  . Midsternal chest pain 04/23/2020    Past Surgical History:  Procedure Laterality Date  . BACK SURGERY    . CORONARY STENT INTERVENTION N/A 04/25/2020   Procedure: CORONARY STENT INTERVENTION;  Surgeon: Lorretta Harp, MD;  Location: [facility];  Service: Cardiovascular;  Laterality: N/A;  . LEFT HEART CATH AND CORONARY ANGIOGRAPHY N/A 04/25/2020   Procedure: LEFT HEART CATH AND CORONARY ANGIOGRAPHY;  Surgeon: Lorretta Harp, MD;  Location: [facility];  Service: Cardiovascular;  Laterality: N/A;       Family History  Problem Relation Age of Onset  . Heart attack Father        died in his sleep, unclear if heart attack or stroke  . Hypertension Father   . Diabetes Mellitus II Father   . Breast cancer Sister   . Kidney cancer Brother   . Parkinson's disease Other   . Heart attack Maternal Grandfather        occurs in his 54s    Social History   Tobacco Use  . Smoking status: Never Smoker  . Smokeless tobacco: Never Used  Vaping Use  . Vaping Use: Never used  Substance Use Topics  . Alcohol use: Not Currently  . Drug use: Never    Home Medications Prior to Admission medications   Medication Sig Start Date End Date Taking? Authorizing Provider  sucralfate (CARAFATE) 1 g tablet Take 1 tablet (1 g total) by mouth 3 (three) times daily with meals. 01/08/21  Yes Corena Herter, PA-C  acetaminophen (TYLENOL) 650 MG CR tablet Take 1,300 mg by mouth 2 (two) times daily as needed for pain.    [provider]  amLODipine (NORVASC) 2.5 MG tablet Take 1 tablet (2.5 mg total) by mouth daily. 10/13/20   Sueanne Margarita, MD  ascorbic acid (VITAMIN C) 500 MG tablet Take 500 mg by mouth 2 (two) times daily.    [provider]   aspirin EC 81 MG tablet Take 81 mg by mouth daily. Swallow whole.    [provider]  atorvastatin (LIPITOR) 40 MG tablet Take 1 tablet (40 mg total) by mouth daily. 10/17/20   Sueanne Margarita, MD  Cholecalciferol (VITAMIN D3 PO) Take 7,500 Units by mouth every evening. 75000IU    [provider]  clopidogrel (PLAVIX) 75 MG tablet Take 1 tablet (75 mg total) by mouth daily with breakfast. 10/19/20   Sueanne Margarita, MD  Digestive Enzymes (DIGESTIVE ENZYME PO) Take 1 tablet by mouth 2 (two) times daily.    [provider]  fluticasone (FLONASE) 50 MCG/ACT nasal spray Place 1 spray into both nostrils 2 (two) times daily as needed for rhinitis (congestion).    [provider]  gabapentin (NEURONTIN) 300 MG capsule Take 600 mg by mouth 3 (three) times daily.    [provider]  Melatonin 10 MG TABS Take 10 mg by mouth at bedtime.    [provider]  nitroGLYCERIN (NITROSTAT) 0.4 MG SL tablet Place 1 tablet (0.4 mg total) under the tongue every 5 (five) minutes x 3 doses as needed for chest pain. 12/12/20   Sueanne Margarita, MD  OVER THE COUNTER MEDICATION Take 1,000 mg by mouth 3 (three) times daily. Beet Root    [provider]  pantoprazole (PROTONIX) 40 MG tablet Take 1 tablet (40 mg total) by mouth daily at 6 (six) AM. 10/19/20   Sueanne Margarita, MD    Allergies    Imdur [isosorbide nitrate], Amitriptyline, Keflex [cephalexin], and Topiramate  Review of Systems   Review of Systems  All other systems reviewed and are negative.   Physical Exam Updated Vital Signs BP 120/77 (BP Location: Right Arm)   Pulse 78   Temp 98 F (36.7 C) (Oral)   Resp 16   Ht 5\' 9"  (1.753 m)   Wt 80.3 kg   SpO2 97%   BMI 26.14 kg/m   Physical Exam Vitals and nursing note reviewed. Exam conducted with a chaperone present.  Constitutional:      General: He is not in acute distress.    Appearance: Normal appearance.  HENT:     Head: Normocephalic  and atraumatic.  Eyes:     General: No scleral icterus.    Conjunctiva/sclera: Conjunctivae normal.  Cardiovascular:     Rate and Rhythm: Normal rate.     Pulses: Normal pulses.  Pulmonary:     Effort: Pulmonary effort is normal. No respiratory distress.  Abdominal:     General: Abdomen is flat. There is no distension.     Palpations: Abdomen is soft.     Tenderness: There is no abdominal tenderness. There is no  right CVA tenderness, left CVA tenderness or guarding.     Comments: Soft, nondistended.  No areas of tenderness.  Reassessed epigastrium and right upper quadrant on multiple occasions, still without any tenderness.  Normoactive bowel sounds.  Musculoskeletal:        General: Normal range of motion.  Skin:    General: Skin is dry.  Neurological:     Mental Status: He is alert and oriented to person, place, and time.     GCS: GCS eye subscore is 4. GCS verbal subscore is 5. GCS motor subscore is 6.  Psychiatric:        Mood and Affect: Mood normal.        Behavior: Behavior normal.        Thought Content: Thought content normal.     ED Results / Procedures / Treatments   Labs (all labs ordered are listed, but only abnormal results are displayed) Labs Reviewed  COMPREHENSIVE METABOLIC PANEL - Abnormal; Notable for the following components:      Result Value   Sodium 132 (*)    Potassium 3.4 (*)    Calcium 8.3 (*)    Total Protein 6.4 (*)    Albumin 3.1 (*)    All other components within normal limits  CBC WITH DIFFERENTIAL/PLATELET - Abnormal; Notable for the following components:   WBC 3.8 (*)    Platelets 122 (*)    All other components within normal limits  LIPASE, BLOOD  URINALYSIS, ROUTINE W REFLEX MICROSCOPIC    EKG None  Radiology No results found.  Procedures Procedures   Medications Ordered in ED Medications - No data to display  ED Course  I have reviewed the triage vital signs and the nursing notes.  Pertinent labs & imaging results that  were available during my care of the patient were reviewed by me and considered in my medical decision making (see chart for details).    MDM Rules/Calculators/A&P                          Laurin Morgenstern was evaluated in Emergency Department on 01/08/2021 for the symptoms described in the history of present illness. He was evaluated in the context of the global COVID-19 pandemic, which necessitated consideration that the patient might be at risk for infection with the SARS-CoV-2 virus that causes COVID-19. Institutional protocols and algorithms that pertain to the evaluation of patients at risk for COVID-19 are in a state of rapid change based on information released by regulatory bodies including the CDC and federal and state organizations. These policies and algorithms were followed during the patient's care in the ED.  I personally reviewed patient's medical chart and all notes from triage and staff during today's encounter. I have also ordered and reviewed all labs and imaging that I felt to be medically necessary in the evaluation of this patient's complaints and with consideration of their physical exam. If needed, translation services were available and utilized.   Patient's history and exam is nonspecific.  Perhaps viral illness.  This is consistent with what he suspected.  His abdominal exam is entirely benign, no areas of tenderness.  Do not feel as though CT imaging is warranted.  No peritoneal signs.  His vital signs have been stable and within normal here in the ED.  He has been afebrile.  Laboratory work-up with only mild electrolyte derangement.  No AKI or transaminitis.  No biliary lab findings concerning for possible cholecystitis.  Negative Murphy sign x2.  CBC with leukopenia, will obtain PCR COVID-19 and influenza testing.  He does not need to wait for results.  His white blood cell count and platelet count is mildly down from baseline.  Encouraged him to follow-up with his primary  care provider regarding these findings given history of lymphoma.  Hemoglobin appears stable.  He has been having increased episodes of reflux disease.  We will discharge him home with Carafate to take in addition to his Protonix.  Encouraging continued abortive medications as needed, suggested Maalox.  Discussed with Dr. Laverta Baltimore who agrees with plan.  EKG with normal sinus rhythm.  Patient is followed by [facility] Dr. Golden Hurter and has an appointment with her on Tuesday.  This patient presents with abdominal pain of unclear etiology. Their evaluation has not identified a emergent etiology for the abdominal pain. Specifically, given the very benign exam, normal laboratory studies, and lack of significant risk factors, I have a very low suspicion for appendicitis, ischemic bowel, bowel perforation, or any other life threatening disease. I have discussed with the patient the level of uncertainty with undifferentiated abdominal pain and clearly explained the need to follow-up as noted on the discharge instructions, or return to the Emergency Department immediately if the pain worsens, develops fever, persistent and uncontrollable vomiting, or for any new symptoms or concerns. I discussed with the patient that this presentation today for abdominal pain could represent a significant risk for an acute abdominal process. Although the tests in the ED were essentially normal, there is still a possibility of a process such as appendicitis, diverticulitis, cholecystitis, ulcer, early bowel obstruction, mesenteric ischemia, kidney stone, or even kidney infection which could subsequently cause disability or death. The patient understands that they must return within 24 hours for a recheck or see their physician within 24 hours for re-exam due to the possibility of significant surgical or medical process.   Final Clinical Impression(s) / ED Diagnoses Final diagnoses:  Viral illness    Rx / DC Orders ED  Discharge Orders         Ordered    sucralfate (CARAFATE) 1 g tablet  3 times daily with meals        01/08/21 1331           Corena Herter, PA-C 01/08/21 1331    Corena Herter, PA-C 01/08/21 1341    Long, Wonda Old[place], MD 01/10/21 1002

## 2021-01-10 ENCOUNTER — Ambulatory Visit: Payer: Managed Care, Other (non HMO) | Admitting: Cardiology

## 2021-01-24 ENCOUNTER — Other Ambulatory Visit: Payer: Self-pay

## 2021-01-24 ENCOUNTER — Ambulatory Visit: Payer: Managed Care, Other (non HMO) | Admitting: Cardiology

## 2021-01-24 ENCOUNTER — Encounter: Payer: Self-pay | Admitting: Cardiology

## 2021-01-24 VITALS — BP 132/76 | HR 68 | Ht 69.0 in | Wt 182.8 lb

## 2021-01-24 DIAGNOSIS — I10 Essential (primary) hypertension: Secondary | ICD-10-CM | POA: Diagnosis not present

## 2021-01-24 DIAGNOSIS — I25118 Atherosclerotic heart disease of native coronary artery with other forms of angina pectoris: Secondary | ICD-10-CM | POA: Diagnosis not present

## 2021-01-24 DIAGNOSIS — E7800 Pure hypercholesterolemia, unspecified: Secondary | ICD-10-CM | POA: Diagnosis not present

## 2021-01-24 NOTE — Addendum Note (Signed)
Addended by: Antonieta Iba on: 01/24/2021 03:55 PM   Modules accepted: Orders

## 2021-01-24 NOTE — Progress Notes (Signed)
Date:  01/24/2021   ID:  Ronald Tapia, DOB 1955-10-31, MRN [id number] The patient was identified using 2 identifiers.   PCP:  Osie Cheeks, PA-C  Cardiologist:  Fransico Him, MD  Electrophysiologist:  None   Evaluation Performed:  Follow-Up Visit  Chief Complaint:  CAD, HTN, HLD  History of Present Illness:    Ronald Tapia is a 65 y.o. male with with history of CAD (s/p PCI of the LAD on 04/25/2020), lymphoma, 2-year history of debilitating headaches ultimately found to be due to CSF leak s/p repair at [facility], GERD, borderline hypertension, hyperlipidemia, & aortic atherosclerosis.  He was admitted for chest pain in July 2021. Echo showed normal LV function without any significant valvular abnormalities. Coronary CT angiography showed possible significant stenosis in the mid LAD with an FFR of 0.72 in the mid vessel and 0.61 in the distal vessel and aortic atherosclerosis. Cath showed severe 90% proximal to mid LAD stenosis and 75% mid LAD stenosis, coronaries otherwise normal. He underwent successful PCI and stenting of the LAD. He was readmitted 7/23-7/24/21 with recurrent chest pain without significant change with NTG. There was an area just above and lateral to his left breast which was slightly tender. His workup was benign with normal sed rate, normal EKG, and completely normal enzymes. His P2Y12 was 71 indicating adequate Plavix response. He was continued on home gabapentin and started on Imdur/Voltaren empirically.  He was seen back in clinic by Melina Copa, PA in August and was doing well. He was walking regularly and was up to 12 minutes twice daily. He would still notice occasional chest discomfort on that same side, worse upon waking up in the morning, specifically on the side he sleeps on and actually would feel better when he walked. He stopped Imdur due to headaches and lack of clinical benefit.   His atorvastatin was reduced due to possible SE of parasthesias,  sluggishness and cold intolerance.  Amlodipine low dose was added for better BP control.    He was back in the hospital in Dec 2021 in the ER with atypical CP reproducible with chest wall palpation and felt to be MSK and resolved.    He is here today for followup and is doing well.  He denies any SOB, DOE, PND, orthopnea, LE edema,  palpitations. He was seen in the ER in Feb with pre -syncope while in the shower early in the am and started to feel dizzy and lightheaded and had presyncope. Shortly after that he had intense abdominal gas pains with nausea and some in his left chest and left arm (unlike his typical anginal and felt more like his pain with GERD). EKG was normal and hsTrop negative.  He has not had any further episodes since then. He denies any anginal symptoms.  He exercises 3 times weekly riding a stationary bike with no problems.   He is compliant with his meds and is tolerating meds with no SE.    Past Medical History:  Diagnosis Date  . Anxiety   . Aortic atherosclerosis ([place])   . CAD (coronary artery disease)    a. 04/2020 Cor CTA: mid LAD dzs (FFR 0.61m, 0.61d); b. 04/2020 PCI: LM nl, LAD 90p/m, 74m (2.75x29mm Resolute Onyx DES), LCX nl, RCA nl.  . Cancer ([place])   . 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 Depression   . GERD (gastroesophageal reflux disease) 04/24/2020  . Headache   .  History of echocardiogram    a. 04/2020 Echo: EF 65-70%, no rwma, triv MR/AI.  Marland Kitchen Hyperlipidemia 04/24/2020  . Lymphoma ([place])   . White coat syndrome without hypertension    Past Surgical History:  Procedure Laterality Date  . BACK SURGERY    . CORONARY STENT INTERVENTION N/A 04/25/2020   Procedure: CORONARY STENT INTERVENTION;  Surgeon: Lorretta Harp, MD;  Location: [facility];  Service: Cardiovascular;  Laterality: N/A;  . LEFT HEART CATH AND CORONARY ANGIOGRAPHY N/A 04/25/2020   Procedure: LEFT HEART CATH AND CORONARY ANGIOGRAPHY;  Surgeon: Lorretta Harp, MD;  Location: [facility];  Service: Cardiovascular;  Laterality: N/A;     Current Meds  Medication Sig  . acetaminophen (TYLENOL) 650 MG CR tablet Take 1,300 mg by mouth 2 (two) times daily as needed for pain.  Marland Kitchen amLODipine (NORVASC) 2.5 MG tablet Take 1 tablet (2.5 mg total) by mouth daily.  Marland Kitchen ascorbic acid (VITAMIN C) 500 MG tablet Take 500 mg by mouth 2 (two) times daily.  Marland Kitchen aspirin EC 81 MG tablet Take 81 mg by mouth daily. Swallow whole.  Marland Kitchen atorvastatin (LIPITOR) 40 MG tablet Take 1 tablet (40 mg total) by mouth daily.  . Cholecalciferol (VITAMIN D3 PO) Take 7,500 Units by mouth every evening. 75000IU  . clopidogrel (PLAVIX) 75 MG tablet Take 1 tablet (75 mg total) by mouth daily with breakfast.  . Digestive Enzymes (DIGESTIVE ENZYME PO) Take 1 tablet by mouth 2 (two) times daily.  . fluticasone (FLONASE) 50 MCG/ACT nasal spray Place 1 spray into both nostrils 2 (two) times daily as needed for rhinitis (congestion).  . gabapentin (NEURONTIN) 300 MG capsule Take 600 mg by mouth 3 (three) times daily.  . Melatonin 10 MG TABS Take 10 mg by mouth at bedtime.  . nitroGLYCERIN (NITROSTAT) 0.4 MG SL tablet Place 1 tablet (0.4 mg total) under the tongue every 5 (five) minutes x 3 doses as needed for chest pain.  Marland Kitchen OVER THE COUNTER MEDICATION Take 1,000 mg by mouth 3 (three) times daily. Beet Root  . pantoprazole (PROTONIX) 40 MG tablet Take 1 tablet (40 mg total) by mouth daily at 6 (six) AM.     Allergies:   Imdur [isosorbide nitrate], Amitriptyline, Keflex [cephalexin], and Topiramate   Social History   Tobacco Use  . Smoking status: Never Smoker  . Smokeless tobacco: Never Used  Vaping Use  . Vaping Use: Never used  Substance Use Topics  . Alcohol use: Not Currently  . Drug use: Never     Family Hx: The patient's family history includes Breast cancer in his sister; Diabetes Mellitus II in his father; Heart attack in his father and maternal grandfather;  Hypertension in his father; Kidney cancer in his brother; Parkinson's disease in an other family member.  ROS:   Please see the history of present illness.    none All other systems reviewed and are negative.   Prior CV studies:   The following studies were reviewed today:  EKG  Labs/Other Tests and Data Reviewed:    EKG:  Not done today  Recent Labs: 05/11/2020: Magnesium 2.1; TSH 2.190 01/08/2021: ALT 28; BUN 15; Creatinine, Ser 0.79; Hemoglobin 14.5; Platelets 122; Potassium 3.4; Sodium 132   Recent Lipid Panel Lab Results  Component Value Date/Time   CHOL 117 06/28/2020 08:39 AM   TRIG 52 06/28/2020 08:39 AM   HDL 40 06/28/2020 08:39 AM   CHOLHDL 2.9 06/28/2020 08:39 AM   CHOLHDL  5.9 04/24/2020 05:32 AM   LDLCALC 65 06/28/2020 08:39 AM    Wt Readings from Last 3 Encounters:  01/24/21 182 lb 12.8 oz (82.9 kg)  01/08/21 177 lb (80.3 kg)  10/20/20 181 lb (82.1 kg)      Objective:    Vital Signs:  BP 132/76   Pulse 68   Ht 5\' 9"  (1.753 m)   Wt 182 lb 12.8 oz (82.9 kg)   SpO2 97%   BMI 26.99 kg/m   GEN: Well nourished, well developed in no acute distress HEENT: Normal NECK: No JVD; No carotid bruits LYMPHATICS: No lymphadenopathy CARDIAC:RRR, no murmurs, rubs, gallops RESPIRATORY:  Clear to auscultation without rales, wheezing or rhonchi  ABDOMEN: Soft, non-tender, non-distended MUSCULOSKELETAL:  No edema; No deformity  SKIN: Warm and dry NEUROLOGIC:  Alert and oriented x 3 PSYCHIATRIC:  Normal affect    ASSESSMENT & PLAN:    1.  ASCAD -Coronary CT angiography showed possible significant stenosis in the mid LAD with an FFR of 0.72 in the mid vessel and 0.61 in the distal vessel and aortic atherosclerosis.  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 He underwent successful PCI and stenting of the LAD. -continued to have chronic CP with negative workup after PCI and felt to possibly be related  to possible GERD/MSK -he had an episode of vague CP in Feb that occurred after he was in a hot shower early in the am and felt dizzy and had pre syncope and then had severe abdominal pain and then some CP that felt like his typical GERD pain.  He has not had any anginal symptoms and exercises on a stationary bike 3 times weekly with no symptoms -continue ASA 81mg  daily, Plavix 75mg  daily and statin  2.  HTN -BP is adequately controlled on exam today -continue amlodipine 2.5mg  daily  3.  HLD -LDL goal < 70 -LDL was 65 in Sept 2021 -his joint pain has improved on lower dose of statin but he is still having some problems with joint pain and stiffness especially in the am -I have told him to hold his statin for 2 weeks and then call to let me know if his sx improved  Tests Ordered: No orders of the defined types were placed in this encounter.   Medication Changes: No orders of the defined types were placed in this encounter.   Follow Up:  In Person in 6 month(s)  Signed, Fransico Him, MD  01/24/2021 3:38 PM    [place]

## 2021-01-24 NOTE — Patient Instructions (Addendum)
Medication Instructions:  Your physician has recommended you make the following change in your medication: 1) HOLD your statin for 2 weeks and call to let us know if your symptoms have improved.   *If you need a refill on your cardiac medications before your next appointment, please call your pharmacy*  Follow-Up: At [facility] you and your health needs are our priority.  As part of our continuing mission to provide you with exceptional heart care, we have created designated Provider Care Teams.  These Care Teams include your primary Cardiologist (physician) and Advanced Practice Providers (APPs -  Physician Assistants and Nurse Practitioners) who all work together to provide you with the care you need, when you need it.  Your next appointment:   1 year  The format for your next appointment:   In Person  Provider:   You may see Fransico Him, MD or one of the following Advanced Practice Providers on your designated Care Team:    Melina Copa, PA-C  Ermalinda Barrios, PA-C

## 2021-02-19 DIAGNOSIS — E7800 Pure hypercholesterolemia, unspecified: Secondary | ICD-10-CM

## 2021-03-16 ENCOUNTER — Ambulatory Visit (INDEPENDENT_AMBULATORY_CARE_PROVIDER_SITE_OTHER): Payer: Managed Care, Other (non HMO) | Admitting: Pharmacist

## 2021-03-16 ENCOUNTER — Other Ambulatory Visit: Payer: Self-pay

## 2021-03-16 DIAGNOSIS — E7800 Pure hypercholesterolemia, unspecified: Secondary | ICD-10-CM

## 2021-03-16 DIAGNOSIS — I25118 Atherosclerotic heart disease of native coronary artery with other forms of angina pectoris: Secondary | ICD-10-CM | POA: Diagnosis not present

## 2021-03-16 MED ORDER — ROSUVASTATIN CALCIUM 5 MG PO TABS: 5.0000 mg | ORAL_TABLET | Freq: Every day | ORAL | 3 refills | Status: DC

## 2021-03-16 NOTE — Progress Notes (Signed)
Patient ID: Ronald Tapia                 DOB: 1955/12/11                    MRN: [id number]     HPI: Ronald Tapia is a 65 y.o. male patient referred to lipid clinic by Dr. Radford Pax. PMH is significant for  CAD (s/p PCI of the LAD on 04/25/2020), lymphoma, 2-year history of debilitating headaches ultimately found to be due to CSF leak s/p repair at [facility], GERD, borderline hypertension, hyperlipidemia, & aortic atherosclerosis. He had muscle aches with atorvastatin 80mg , so it was reduced to 40mg . He felt better, but still was having issues. Therefore atorvastatin was stopped and patient was referred to lipid clinic.  Patient presents today to lipid clinic accompanied by his wife. He states that he resumed atorvastatin because he knew it would be a little while until he was seen in the lipid clinic. He states he saw an integrative medicine doctor in [place] a few years ago who told him he doesn't metabolize medicine well. He has several great questions about medicines and side effects. He goes to the gym 2-3 sometimes 4 times a week. Does a combo of strength training and aerobic exercise.   Current Medications: atorvastatin 40mg  daily Intolerances: atrovastatin 80mg  daily, atorvastatin 40mg  daily (joint/muscle pains) Risk Factors: CAD, HTN, family hx LDL goal: <70  Diet: breakfast: 1 egg, 1 slice chicken bacon, 1/2 slice whole grain seeded bread Lunch: leftovers of supper, chicken, vegetables (green beans, squash, potatoes, broccoli) pinto beans Dinner: some red meat, salmon  Snacks: gluten free crackers w/ PB Drink: water, occasional ginger ale  Exercise: works out 2-3 times a week- cardio plus strength (40 min at a time)  Family History: The patient's family history includes Breast cancer in his sister; Diabetes Mellitus II in his father; Heart attack in his father and maternal grandfather; Hypertension in his father; Kidney cancer in his brother; Parkinson's  disease in an other family member.  Social History: no alcohol, never smoked  Labs: 1218/21 TC 106, TG 51, HDL 37, LDL direct 61 (atorvastatin 80mg  daily)  Past Medical History:  Diagnosis Date   Anxiety    Aortic atherosclerosis (HCC)    CAD (coronary artery disease)    a. 04/2020 Cor CTA: mid LAD dzs (FFR 0.69m, 0.61d); b. 04/2020 PCI: LM nl, LAD 90p/m, 76m (2.75x61mm Resolute Onyx DES), LCX nl, RCA nl.   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 04/2020 Echo: EF 65-70%, no rwma, triv MR/AI.   Hyperlipidemia 04/24/2020   Lymphoma ([place])    White coat syndrome without hypertension     Current Outpatient Medications on File Prior to Visit  Medication Sig Dispense Refill   acetaminophen (TYLENOL) 650 MG CR tablet Take 1,300 mg by mouth 2 (two) times daily as needed for pain.     amLODipine (NORVASC) 2.5 MG tablet Take 1 tablet (2.5 mg total) by mouth daily. 90 tablet 3   ascorbic acid (VITAMIN C) 500 MG tablet Take 500 mg by mouth 2 (two) times daily.     aspirin EC 81 MG tablet Take 81 mg by mouth daily. Swallow whole.     Cholecalciferol (VITAMIN D3 PO) Take 7,500  Units by mouth every evening. 75000IU     clopidogrel (PLAVIX) 75 MG tablet Take 1 tablet (75 mg total) by mouth daily with breakfast. 90 tablet 3   Digestive Enzymes (DIGESTIVE ENZYME PO) Take 1 tablet by mouth 2 (two) times daily.     fluticasone (FLONASE) 50 MCG/ACT nasal spray Place 1 spray into both nostrils 2 (two) times daily as needed for rhinitis (congestion).     gabapentin (NEURONTIN) 300 MG capsule Take 600 mg by mouth 3 (three) times daily.     Melatonin 10 MG TABS Take 10 mg by mouth at bedtime.     nitroGLYCERIN (NITROSTAT) 0.4 MG SL tablet Place 1 tablet (0.4 mg total) under the tongue every 5 (five) minutes x 3 doses as needed for chest pain. 25 tablet 3    OVER THE COUNTER MEDICATION Take 1,000 mg by mouth 3 (three) times daily. Beet Root     pantoprazole (PROTONIX) 40 MG tablet Take 1 tablet (40 mg total) by mouth daily at 6 (six) AM. 90 tablet 3   No current facility-administered medications on file prior to visit.    Allergies  Allergen Reactions   Imdur [Isosorbide Nitrate] Other (See Comments)    Severe headache lasting about 12 hours   Amitriptyline Other (See Comments)    Made me feel strange   Keflex [Cephalexin] Other (See Comments)    Stomach pain   Topiramate Other (See Comments)    Made me feel strange    Assessment/Plan:  1. Hyperlipidemia - Patient is not tolerating atorvastatin 40mg  daily. We discussed options of switching to a more hydrophilic statin (like rosuvastatin) vs PCSK9i vs zetia. We talked about the pros, cons, side effects and cost of all the options. Patient is agreeable to trying rosuvastatin. He would like to start a the low dose 5mg  and increase as tolerated. I will call patient in 1 month to see how he is doing. At that time we can either set up labs or just increase rosuvastatin dose if he is doing well. I have encouraged him to increase his exercise to 4 days a week. Diet is well controlled.   Thank you,   Ramond Dial, Pharm.D, BCPS, CPP [place]  [address] [place], [place] [address]  Phone: [phone]; Fax: [phone]

## 2021-03-16 NOTE — Patient Instructions (Signed)
Try to increase your exercise to 4 days a week  STOP taking atorvastatin. When you feel back to baseline START rosuvastatin 5mg  daily  I will call you in 1 month to see how you are doing  Please call me at [phone] with any questions or problems

## 2021-03-17 ENCOUNTER — Telehealth: Payer: Self-pay | Admitting: Pharmacist

## 2021-03-17 NOTE — Telephone Encounter (Signed)
Called and LVM for medical records. Requesting last OV note and labs done for "medication sensitivity" /medication metabolizing

## 2021-04-13 ENCOUNTER — Telehealth: Payer: Self-pay | Admitting: Pharmacist

## 2021-04-13 DIAGNOSIS — E7800 Pure hypercholesterolemia, unspecified: Secondary | ICD-10-CM

## 2021-04-13 NOTE — Telephone Encounter (Signed)
Called pt and LVM to return call. Calling to see how patient is doing on rosuvastatin 5mg  daily and see if he is willing to increase to 10mg .

## 2021-05-09 NOTE — Telephone Encounter (Signed)
Patient called back. He states he is doing alright on the rosuvastatin '5mg'$  daily. Less aches than before. Does not want to increase at this time. Would prefer to check labs first. He wishes to go to a local lab corp. Orders placed and released.

## 2021-05-13 LAB — LIPID PANEL
Chol/HDL Ratio: 2.8 ratio (ref 0.0–5.0)
Cholesterol, Total: 140 mg/dL (ref 100–199)
HDL: 50 mg/dL (ref >39)
LDL Chol Calc (NIH): 80 mg/dL (ref 0–99)
Triglycerides: 43 mg/dL (ref 0–149)
VLDL Cholesterol Cal: 10 mg/dL (ref 5–40)

## 2021-05-15 NOTE — Telephone Encounter (Signed)
Reviewed labs with patient. Discussed the possibility of being more aggressive with LDL lowering. Suggested we could try alternating '5mg'$  and '10mg'$  or adding zetia. Patient wished to continue with '5mg'$  daily and just continue to monitor. Will recheck in 3-6 months.

## 2021-05-31 ENCOUNTER — Other Ambulatory Visit: Payer: Self-pay | Admitting: *Deleted

## 2021-05-31 MED ORDER — ROSUVASTATIN CALCIUM 5 MG PO TABS: 5.0000 mg | ORAL_TABLET | Freq: Every day | ORAL | 1 refills | Status: DC

## 2021-06-11 ENCOUNTER — Other Ambulatory Visit: Payer: Self-pay

## 2021-06-11 ENCOUNTER — Encounter (HOSPITAL_BASED_OUTPATIENT_CLINIC_OR_DEPARTMENT_OTHER): Payer: Self-pay | Admitting: *Deleted

## 2021-06-11 ENCOUNTER — Emergency Department (HOSPITAL_BASED_OUTPATIENT_CLINIC_OR_DEPARTMENT_OTHER)
Admission: EM | Admit: 2021-06-11 | Discharge: 2021-06-11 | Disposition: A | Discharge status: Home / Self Care | Payer: Managed Care, Other (non HMO) | Attending: Emergency Medicine | Admitting: Emergency Medicine

## 2021-06-11 ENCOUNTER — Emergency Department (HOSPITAL_BASED_OUTPATIENT_CLINIC_OR_DEPARTMENT_OTHER): Payer: Managed Care, Other (non HMO)

## 2021-06-11 DIAGNOSIS — R739 Hyperglycemia, unspecified: Secondary | ICD-10-CM | POA: Diagnosis not present

## 2021-06-11 DIAGNOSIS — Z859 Personal history of malignant neoplasm, unspecified: Secondary | ICD-10-CM | POA: Diagnosis not present

## 2021-06-11 DIAGNOSIS — Z7902 Long term (current) use of antithrombotics/antiplatelets: Secondary | ICD-10-CM | POA: Diagnosis not present

## 2021-06-11 DIAGNOSIS — I2510 Atherosclerotic heart disease of native coronary artery without angina pectoris: Secondary | ICD-10-CM | POA: Diagnosis not present

## 2021-06-11 DIAGNOSIS — Z79899 Other long term (current) drug therapy: Secondary | ICD-10-CM | POA: Insufficient documentation

## 2021-06-11 DIAGNOSIS — N492 Inflammatory disorders of scrotum: Secondary | ICD-10-CM | POA: Diagnosis not present

## 2021-06-11 DIAGNOSIS — R1031 Right lower quadrant pain: Secondary | ICD-10-CM | POA: Diagnosis present

## 2021-06-11 DIAGNOSIS — Z7982 Long term (current) use of aspirin: Secondary | ICD-10-CM | POA: Diagnosis not present

## 2021-06-11 DIAGNOSIS — K219 Gastro-esophageal reflux disease without esophagitis: Secondary | ICD-10-CM | POA: Diagnosis not present

## 2021-06-11 DIAGNOSIS — K5792 Diverticulitis of intestine, part unspecified, without perforation or abscess without bleeding: Secondary | ICD-10-CM | POA: Diagnosis not present

## 2021-06-11 DIAGNOSIS — I10 Essential (primary) hypertension: Secondary | ICD-10-CM | POA: Diagnosis not present

## 2021-06-11 DIAGNOSIS — Z955 Presence of coronary angioplasty implant and graft: Secondary | ICD-10-CM | POA: Insufficient documentation

## 2021-06-11 LAB — BASIC METABOLIC PANEL
Anion gap: 7 (ref 5–15)
BUN: 18 mg/dL (ref 8–23)
CO2: 28 mmol/L (ref 22–32)
Calcium: 9.2 mg/dL (ref 8.9–10.3)
Chloride: 100 mmol/L (ref 98–111)
Creatinine, Ser: 1.06 mg/dL (ref 0.61–1.24)
GFR, Estimated: >60 mL/min (ref >60)
Glucose, Bld: 164 mg/dL — ABNORMAL HIGH (ref 70–99)
Potassium: 3.9 mmol/L (ref 3.5–5.1)
Sodium: 135 mmol/L (ref 135–145)

## 2021-06-11 LAB — CBC WITH DIFFERENTIAL/PLATELET
Abs Immature Granulocytes: 0.03 10*3/uL (ref 0.00–0.07)
Basophils Absolute: 0 10*3/uL (ref 0.0–0.1)
Basophils Relative: 0 %
Eosinophils Absolute: 0 10*3/uL (ref 0.0–0.5)
Eosinophils Relative: 0 %
HCT: 44.1 % (ref 39.0–52.0)
Hemoglobin: 15 g/dL (ref 13.0–17.0)
Immature Granulocytes: 0 %
Lymphocytes Relative: 10 %
Lymphs Abs: 1 10*3/uL (ref 0.7–4.0)
MCH: 31.9 pg (ref 26.0–34.0)
MCHC: 34 g/dL (ref 30.0–36.0)
MCV: 93.8 fL (ref 80.0–100.0)
Monocytes Absolute: 0.6 10*3/uL (ref 0.1–1.0)
Monocytes Relative: 6 %
Neutro Abs: 8.3 10*3/uL — ABNORMAL HIGH (ref 1.7–7.7)
Neutrophils Relative %: 84 %
Platelets: 160 10*3/uL (ref 150–400)
RBC: 4.7 MIL/uL (ref 4.22–5.81)
RDW: 11.9 % (ref 11.5–15.5)
WBC: 10 10*3/uL (ref 4.0–10.5)
nRBC: 0 % (ref 0.0–0.2)

## 2021-06-11 LAB — URINALYSIS, ROUTINE W REFLEX MICROSCOPIC
Bilirubin Urine: NEGATIVE
Glucose, UA: NEGATIVE mg/dL
Hgb urine dipstick: NEGATIVE
Ketones, ur: NEGATIVE mg/dL
Leukocytes,Ua: NEGATIVE
Nitrite: NEGATIVE
Protein, ur: NEGATIVE mg/dL
Specific Gravity, Urine: 1.015 (ref 1.005–1.030)
pH: 7.5 (ref 5.0–8.0)

## 2021-06-11 MED ORDER — IOHEXOL 350 MG/ML SOLN
100.0000 mL | Freq: Once | INTRAVENOUS | Status: AC | PRN
  Administered 2021-06-11: 85 mL via INTRAVENOUS

## 2021-06-11 MED ORDER — AMOXICILLIN-POT CLAVULANATE 875-125 MG PO TABS: 1.0000 | ORAL_TABLET | Freq: Two times a day (BID) | ORAL | 0 refills | Status: DC

## 2021-06-11 NOTE — ED Triage Notes (Signed)
Presents with abdominal pain, onset approx 4 days ago, rt side of groin area. Denies any nausea, vomiting or diarrhea. States he had a fever last night. Poor appetite. Able to eat and drink without any problems. Having normal bowel movements

## 2021-06-11 NOTE — ED Provider Notes (Signed)
[facility] Provider Note   CSN: [id number] Arrival date & time: 06/11/21  0908     History Chief Complaint  Patient presents with   Abdominal Pain    Ronald Tapia is a 65 y.o. male.  Patient with a complaint of abdominal pain.  Started 4 days ago with bilateral lower quadrant kind of groin area of right greater than left.  And then went into some periumbilical abdominal discomfort.  No nausea no vomiting.  Patient states about 6 years ago he was diagnosed with swelling to his left scrotal area.  Patient was given a specific diagnosis.  Patient does not recall what it was.  Has not followed up with urology.  Past medical history is significant for coronary artery disease with stents hyperlipidemia and lymphoma.  Patient denies any dysuria or diarrhea.      Past Medical History:  Diagnosis Date   Anxiety    Aortic atherosclerosis (HCC)    CAD (coronary artery disease)    a. 04/2020 Cor CTA: mid LAD dzs (FFR 0.48m 0.61d); b. 04/2020 PCI: LM nl, LAD 90p/m, 722m2.75x3839mesolute Onyx DES), LCX nl, RCA nl.   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 04/2020 Echo: EF 65-70%, no rwma, triv MR/AI.   Hyperlipidemia 04/24/2020   [place]  White coat syndrome without hypertension     Patient Active Problem List   Diagnosis Date Noted   Chest pain 04/30/2020   [facility]  Essential hypertension    Chronic headaches 04/25/2020   Chest pain of uncertain etiology [id number] (gastroesophageal reflux disease) 04/24/2020   Hyperlipidemia 04/24/2020   Midsternal chest pain 04/23/2020    Past Surgical History:  Procedure Laterality Date   BACK SURGERY     CORONARY STENT INTERVENTION N/A 04/25/2020    Procedure: CORONARY STENT INTERVENTION;  Surgeon: BerLorretta HarpD;  Location: [facility];  Service: Cardiovascular;  Laterality: N/A;   LEFT HEART CATH AND CORONARY ANGIOGRAPHY N/A 04/25/2020   Procedure: LEFT HEART CATH AND CORONARY ANGIOGRAPHY;  Surgeon: BerLorretta HarpD;  Location: [facility];  Service: Cardiovascular;  Laterality: N/A;       Family History  Problem Relation Age of Onset   Heart attack Father        died in his sleep, unclear if heart attack or stroke   Hypertension Father    Diabetes Mellitus II Father    Breast cancer Sister    Kidney cancer Brother    Parkinson's disease Other    Heart attack Maternal Grandfather        occurs in his 50s36s Social History   Tobacco Use   Smoking status: Never   Smokeless tobacco: Never  Vaping Use   Vaping Use: Never used  Substance Use Topics   Alcohol use: Not Currently   Drug use: Never    Home Medications Prior to Admission medications   Medication Sig Start Date End Date Taking? Authorizing Provider  amLODipine (NORVASC) 2.5 MG tablet Take 1 tablet (2.5 mg total) by mouth daily. 10/13/20  Yes Turner, TraEber HongD  ascorbic acid (VITAMIN C) 500 MG tablet  Take 500 mg by mouth 2 (two) times daily.   Yes [provider]  aspirin EC 81 MG tablet Take 81 mg by mouth daily. Swallow whole.   Yes [provider]  Cholecalciferol (VITAMIN D3 PO) Take 7,500 Units by mouth every evening. 75000IU   Yes [provider]  clopidogrel (PLAVIX) 75 MG tablet Take 1 tablet (75 mg total) by mouth daily with breakfast. 10/19/20  Yes Turner, Eber Hong, MD  Digestive Enzymes (DIGESTIVE ENZYME PO) Take 1 tablet by mouth 2 (two) times daily.   Yes [provider]  gabapentin (NEURONTIN) 300 MG capsule Take 600 mg by mouth 3 (three) times daily.   Yes [provider]  Melatonin 10 MG TABS Take 10 mg by mouth at bedtime.   Yes [provider]  pantoprazole (PROTONIX)  40 MG tablet Take 1 tablet (40 mg total) by mouth daily at 6 (six) AM. 10/19/20  Yes Turner, Eber Hong, MD  rosuvastatin (CRESTOR) 5 MG tablet Take 1 tablet (5 mg total) by mouth daily. 05/31/21  Yes Turner, Eber Hong, MD  acetaminophen (TYLENOL) 650 MG CR tablet Take 1,300 mg by mouth 2 (two) times daily as needed for pain.    [provider]  fluticasone (FLONASE) 50 MCG/ACT nasal spray Place 1 spray into both nostrils 2 (two) times daily as needed for rhinitis (congestion).    [provider]  nitroGLYCERIN (NITROSTAT) 0.4 MG SL tablet Place 1 tablet (0.4 mg total) under the tongue every 5 (five) minutes x 3 doses as needed for chest pain. 12/12/20   Sueanne Margarita, MD  OVER THE COUNTER MEDICATION Take 1,000 mg by mouth 3 (three) times daily. Beet Root    [provider]    Allergies    Imdur [isosorbide nitrate], Amitriptyline, Keflex [cephalexin], and Topiramate  Review of Systems   Review of Systems  Constitutional:  Negative for chills and fever.  HENT:  Negative for ear pain and sore throat.   Eyes:  Negative for pain and visual disturbance.  Respiratory:  Negative for cough and shortness of breath.   Cardiovascular:  Negative for chest pain and palpitations.  Gastrointestinal:  Positive for abdominal pain. Negative for nausea and vomiting.  Genitourinary:  Positive for scrotal swelling. Negative for dysuria and hematuria.  Musculoskeletal:  Negative for arthralgias and back pain.  Skin:  Negative for color change and rash.  Neurological:  Negative for seizures and syncope.  All other systems reviewed and are negative.  Physical Exam Updated Vital Signs BP (!) 144/83   Pulse 81   Temp 98.5 F (36.9 C) (Oral)   Resp 16   Ht 1.753 m ('5\' 9"'$ )   Wt 82.6 kg   SpO2 99%   BMI 26.88 kg/m   Physical Exam Vitals and nursing note reviewed.  Constitutional:      General: He is not in acute distress.    Appearance: Normal appearance. He is well-developed.   HENT:     Head: Normocephalic and atraumatic.  Eyes:     Extraocular Movements: Extraocular movements intact.     Conjunctiva/sclera: Conjunctivae normal.     Pupils: Pupils are equal, round, and reactive to light.  Cardiovascular:     Rate and Rhythm: Normal rate and regular rhythm.     Heart sounds: No murmur heard. Pulmonary:     Effort: Pulmonary effort is normal. No respiratory distress.     Breath sounds: Normal breath sounds.  Abdominal:     General:  There is no distension.     Palpations: Abdomen is soft. There is no mass.     Tenderness: There is no abdominal tenderness. There is no guarding.  Genitourinary:    Penis: Normal.      Comments: Swelling to left scrotum.  Suggestive may be a hydrocele.  Nontender.  Right testicle normal no swelling. Musculoskeletal:        General: Normal range of motion.     Cervical back: Neck supple.  Skin:    General: Skin is warm and dry.  Neurological:     General: No focal deficit present.     Mental Status: He is alert and oriented to person, place, and time.    ED Results / Procedures / Treatments   Labs (all labs ordered are listed, but only abnormal results are displayed) Labs Reviewed  CBC WITH DIFFERENTIAL/PLATELET - Abnormal; Notable for the following components:      Result Value   Neutro Abs 8.3 (*)    All other components within normal limits  BASIC METABOLIC PANEL - Abnormal; Notable for the following components:   Glucose, Bld 164 (*)    All other components within normal limits  URINALYSIS, ROUTINE W REFLEX MICROSCOPIC    EKG None  Radiology CT Abdomen Pelvis W Contrast  Result Date: 06/11/2021 CLINICAL DATA:  Right lower quadrant abdominal pain. EXAM: CT ABDOMEN AND PELVIS WITH CONTRAST TECHNIQUE: Multidetector CT imaging of the abdomen and pelvis was performed using the standard protocol following bolus administration of intravenous contrast. CONTRAST:  64m OMNIPAQUE IOHEXOL 350 MG/ML SOLN COMPARISON:  CT  abdomen pelvis dated 03/24/2021. FINDINGS: Lower chest: There is minimal bilateral dependent atelectasis. Hepatobiliary: No focal liver abnormality is seen. No gallstones, gallbladder wall thickening, or biliary dilatation. Pancreas: Unremarkable. No pancreatic ductal dilatation or surrounding inflammatory changes. Spleen: Normal in size without focal abnormality. Adrenals/Urinary Tract: Adrenal glands are unremarkable. Kidneys are normal, without renal calculi, focal lesion, or hydronephrosis. Bladder is unremarkable. Stomach/Bowel: Stomach is within normal limits. There is acute diverticulitis of the distal descending and proximal sigmoid colon with surrounding fat stranding. No evidence of abscess or fistula formation. Appendix appears normal. No evidence of bowel obstruction. Vascular/Lymphatic: Aortic atherosclerosis. 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 No enlarged abdominal or pelvic lymph nodes. Reproductive: Prostate is unremarkable. Other: Small fat containing left inguinal hernia, unchanged. No abdominopelvic ascites. 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 IMPRESSION: Acute diverticulitis involving the distal descending and proximal sigmoid colon. No evidence of abscess or fistula. Aortic Atherosclerosis (ICD10-I70.0). Electronically Signed   By: TZerita BoersM.D.   On: 06/11/2021 10:42    Procedures Procedures   Medications Ordered in ED Medications  iohexol (OMNIPAQUE) 350 MG/ML injection 100 mL (85 mLs Intravenous Contrast Given 06/11/21 1019)    ED Course  I have reviewed the triage vital signs and the nursing notes.  Pertinent labs & imaging results that were available during my care of the patient were reviewed by me and considered in my medical decision making (see chart for details).    MDM Rules/Calculators/A&P                            Abdominal exam without any acute findings.  Nontender no guarding.  Scrotal exam has left scrotal swelling.  Which patient states been there for  6 years.  No evidence of any bulge in the groin area.  But patient was not stood up.  Based on his complaint we will go ahead and get CT scan of the abdomen.  Patient's labs no leukocytosis urinalysis normal.  Electrolytes mild hyperglycemia with glucose of 164.  Renal function normal.  CT scan of the abdomen consistent with diverticulitis without any complications.  We will treat patient with Augmentin.  Return precautions given.  Patient is already getting plugged into gastroenterology for colonoscopy surveillance.  Patient's labs without any significant abnormalities.  No leukocytosis.   Final Clinical Impression(s) / ED Diagnoses Final diagnoses:  Diverticulitis    Rx / DC Orders ED Discharge Orders     None        Fredia Sorrow, MD 06/11/21 1145

## 2021-06-11 NOTE — Discharge Instructions (Addendum)
CT scan not show diverticulitis.  Take the antibiotic as directed for the next 7 days.  Schedule a follow-up with your primary care doctor and keep your appointment with gastroenterology.

## 2021-06-13 ENCOUNTER — Telehealth: Payer: Self-pay | Admitting: *Deleted

## 2021-06-13 NOTE — Telephone Encounter (Signed)
Patient is returning call. Call transferred to covering APP.

## 2021-06-13 NOTE — Telephone Encounter (Signed)
   Name: Ronald Tapia  DOB: Oct 01, 1956  MRN: [id number]   Primary Cardiologist: Fransico Him, MD  Chart reviewed as part of pre-operative protocol coverage. Patient was contacted 06/13/2021 in reference to pre-operative risk assessment for pending surgery as outlined below.  Kader Delone was last seen on 01/24/21 by Dr. Radford Pax.  Since that day, Elsworth Casteel has done well. He exercises 3 times per week with weights and cardio without angina. He is now more than 12 months since his PCI. He may hold palvix for 5-7 days prior to procedure. He should continue ASA throughout the perioperative period.   Therefore, based on ACC/AHA guidelines, the patient would be at acceptable risk for the planned procedure without further cardiovascular testing.   The patient was advised that if he develops new symptoms prior to surgery to contact our office to arrange for a follow-up visit, and he verbalized understanding.  I will route this recommendation to the requesting party via Epic fax function and remove from pre-op pool. Please call with questions.  Tami Lin Tyde Lamison, PA 06/13/2021, 4:44 PM

## 2021-06-13 NOTE — Telephone Encounter (Signed)
Dr. Radford Pax Pt is beyond 12 months from [place] x 2 to LAD (04/2020). OK to hold plavix for colonoscopy?

## 2021-06-13 NOTE — Telephone Encounter (Signed)
Left VM  Per Dr. Radford Pax - OK to hold plavix, 6 months from PCI.

## 2021-06-13 NOTE — Telephone Encounter (Signed)
   [facility] Pre-operative Risk Assessment    Patient Name: Ronald Tapia  DOB: 19-Aug-1956 MRN: [id number]  HEARTCARE STAFF:  - IMPORTANT!!!!!! Under Visit Info/Reason for Call, type in Other and utilize the format Clearance MM/DD/YY or Clearance TBD. Do not use dashes or single digits. - Please review there is not already an duplicate clearance open for this procedure. - If request is for dental extraction, please clarify the # of teeth to be extracted. - If the patient is currently at the dentist's office, call Pre-Op Callback Staff (MA/nurse) to input urgent request.  - If the patient is not currently in the dentist office, please route to the Pre-Op pool.  Request for surgical clearance:  What type of surgery is being performed? COLONOSCOPY  When is this surgery scheduled? TBD  What type of clearance is required (medical clearance vs. Pharmacy clearance to hold med vs. Both)? MEDICAL  Are there any medications that need to be held prior to surgery and how long?  PLAVIX x 5-7 DAYS PRIOR  Practice name and name of physician performing surgery? [facility]; DR. LE  What is the office phone number? [phone]   7.   What is the office fax number? [phone]  8.   Anesthesia type (None, local, MAC, general) ? NOT LISTED (PROPOFOL?)   Julaine Hua 06/13/2021, 11:07 AM  _________________________________________________________________   (provider comments below)

## 2021-08-04 IMAGING — CR DG CHEST 2V
2 series · 2 of 2 positions shown · non-contrast
Comparison: 03/01/2016

CLINICAL DATA: Chest pain.  Elevated blood pressure.

EXAM:
CHEST - 2 VIEW

[w chest pa]
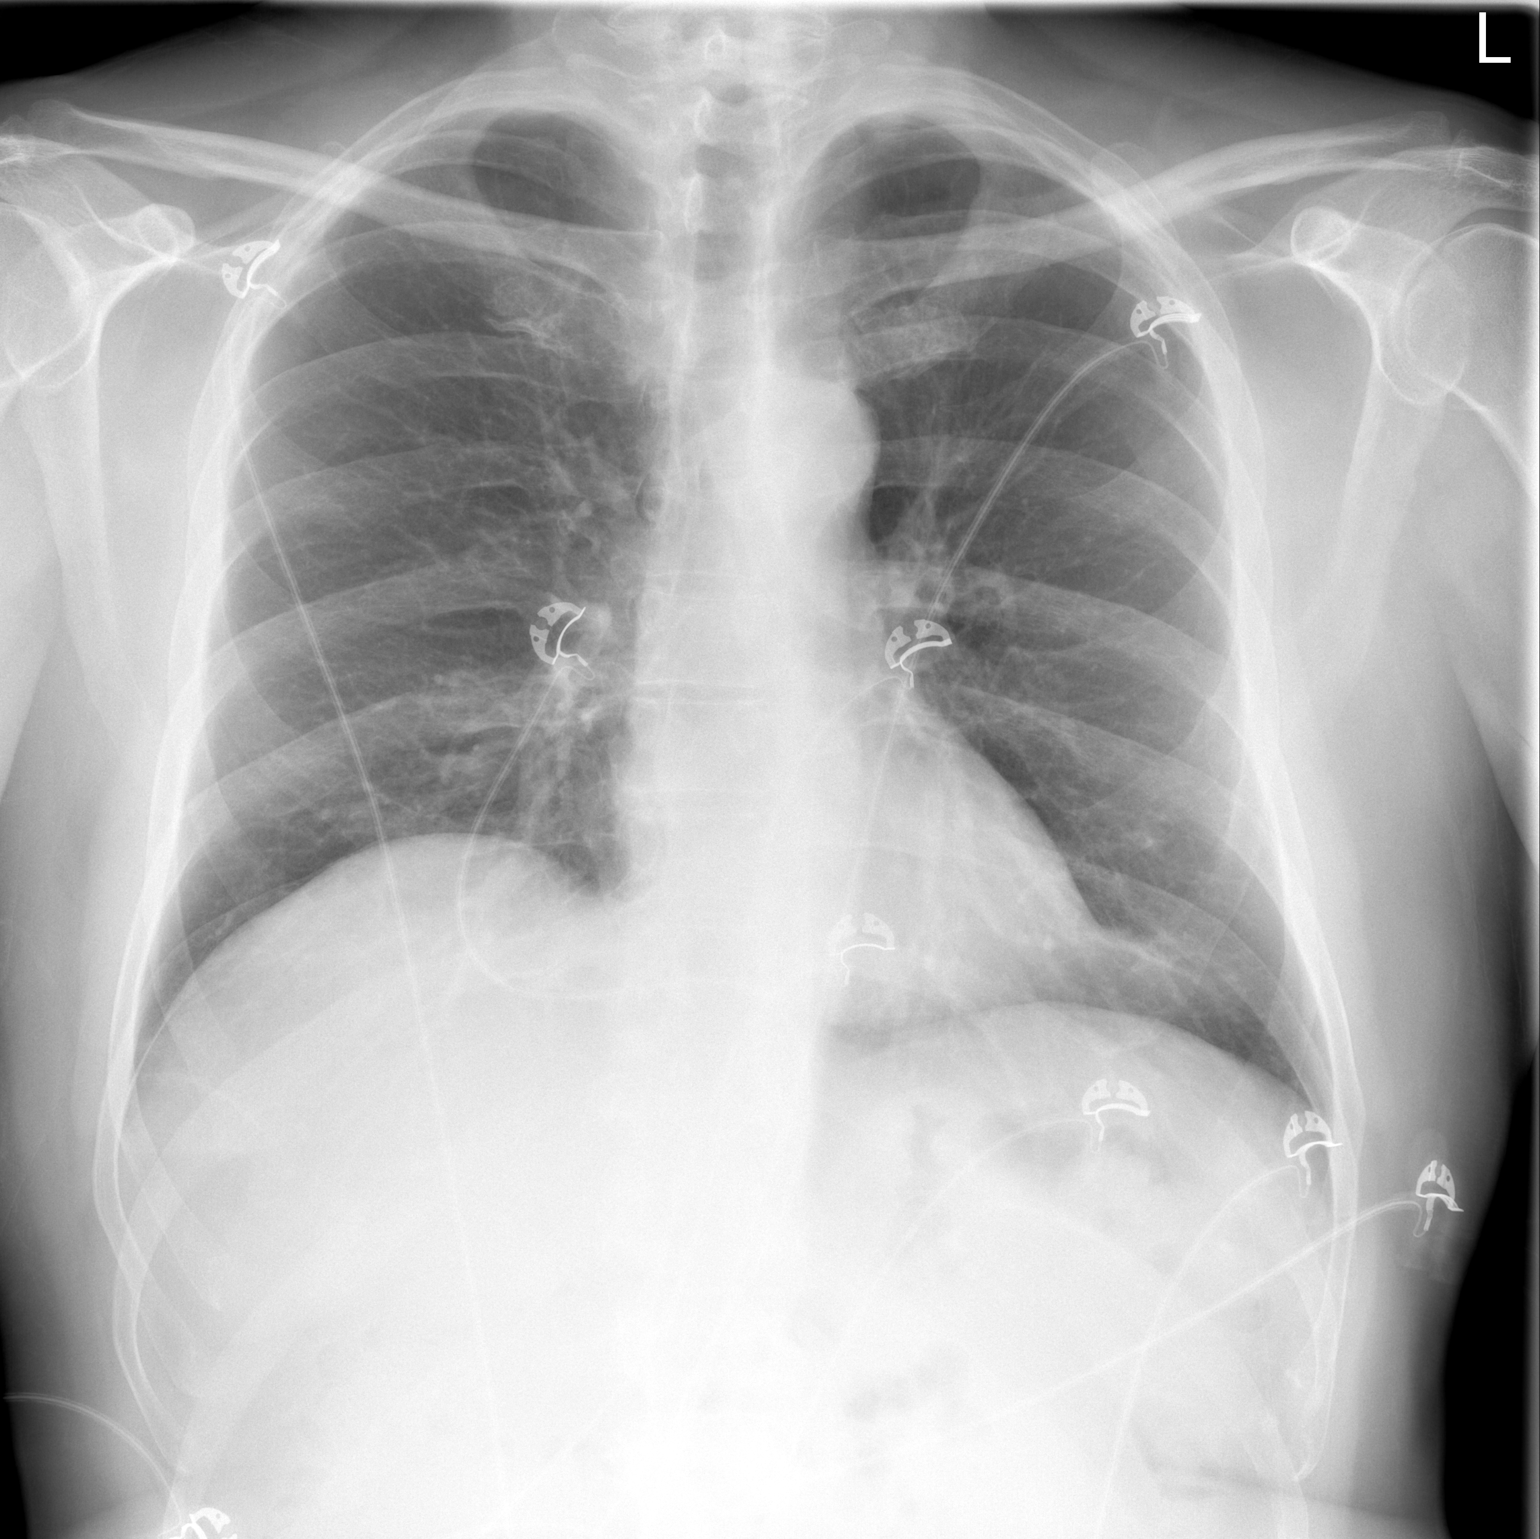

[w chest lat]
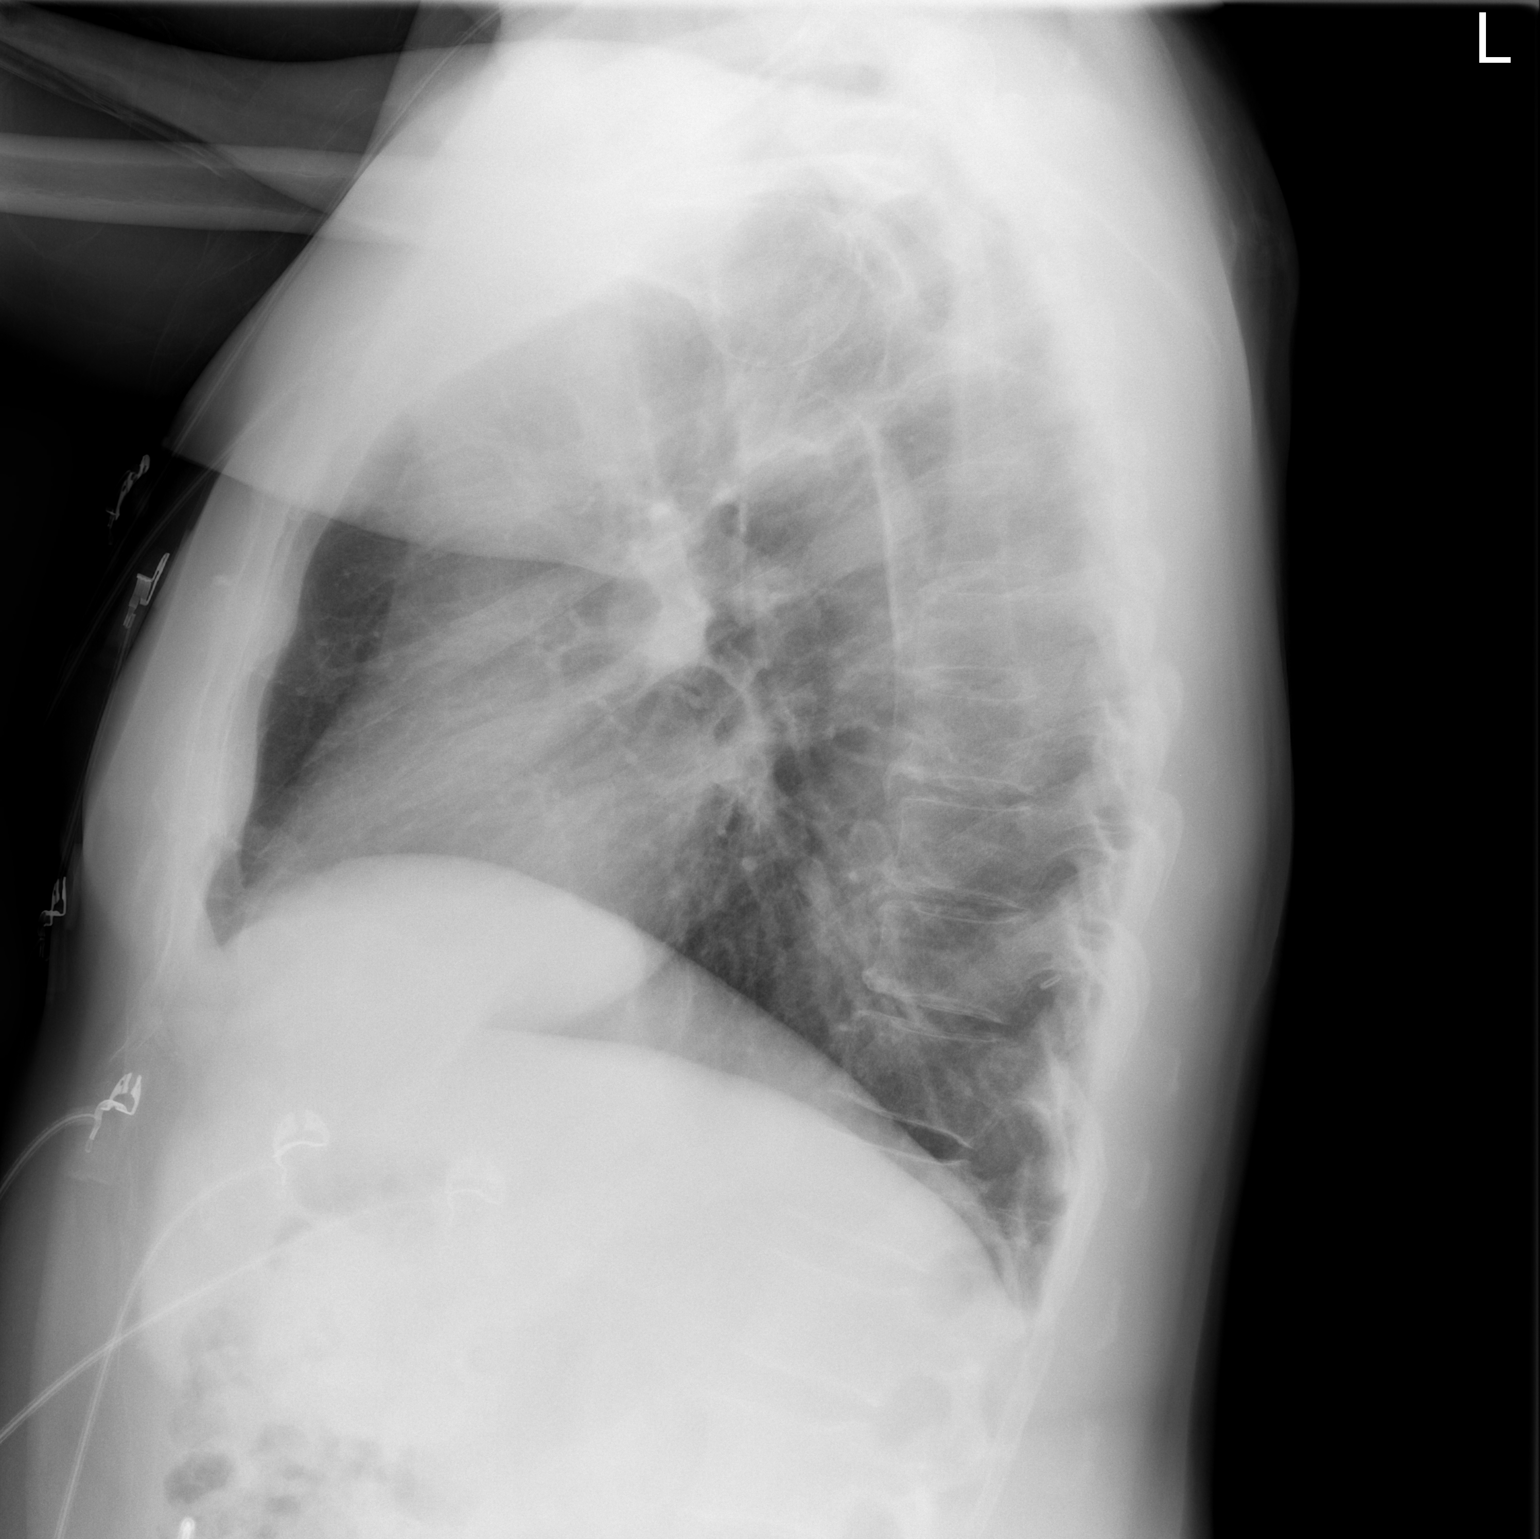

[2 of 2 positions shown; findings below may reference images not displayed]

FINDINGS: Mild right hemidiaphragm elevation. Numerous leads and wires project
over the chest. Midline trachea. Normal heart size and mediastinal
contours. No pleural effusion or pneumothorax. Clear lungs.
IMPRESSION: No acute cardiopulmonary disease.

## 2021-08-05 IMAGING — CT CT HEART MORP W/ CTA COR W/ SCORE W/ CA W/CM &/OR W/O CM
4 of 7 series · 8 of 20 positions shown, 9 images · IV contrast (APPLIED)
Comparison: Chest CT 01/25/2020.

Addendum:
EXAM:
Cardiac/Coronary  CT
TECHNIQUE: The patient was scanned on a [employer] Force scanner.

[Series 6: best diast 74 % · axial · 0.39mm/px · z∈[-248,-206]mm · 2 of 316 slices shown]
[im 106/316  vessel]
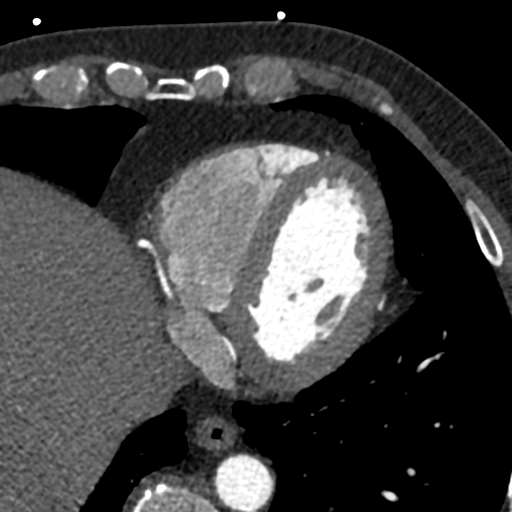
[im 211/316  vessel]
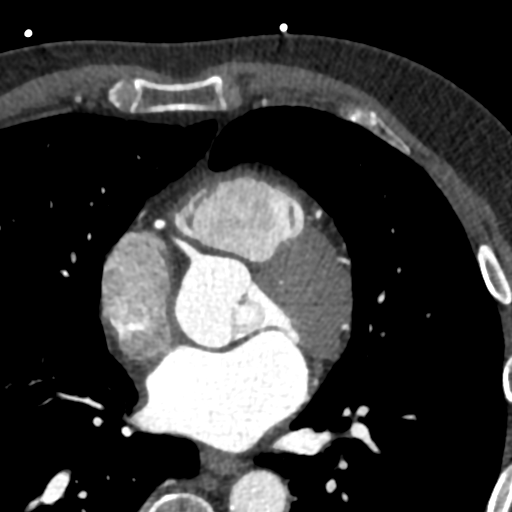

[Series 7: best syst · axial · 0.39mm/px · z∈[-248,-206]mm · 2 of 316 slices shown, 3 images]
[im 106/316  vessel]
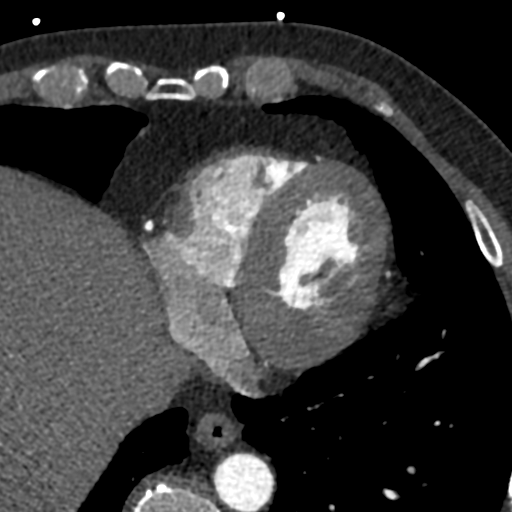
[im 106/316  lung]
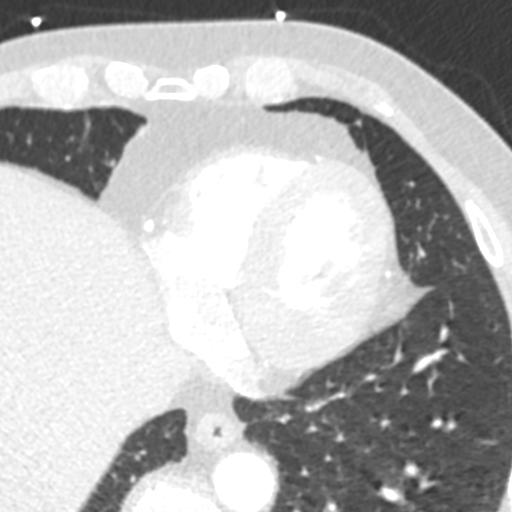
[im 211/316  vessel]
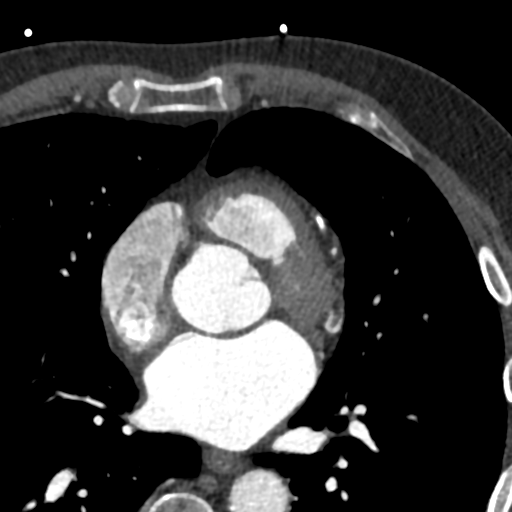

[Series 8: ts diast sharp 74 % · axial · 0.39mm/px · z∈[-248,-206]mm · 2 of 316 slices shown]
[im 106/316  lung]
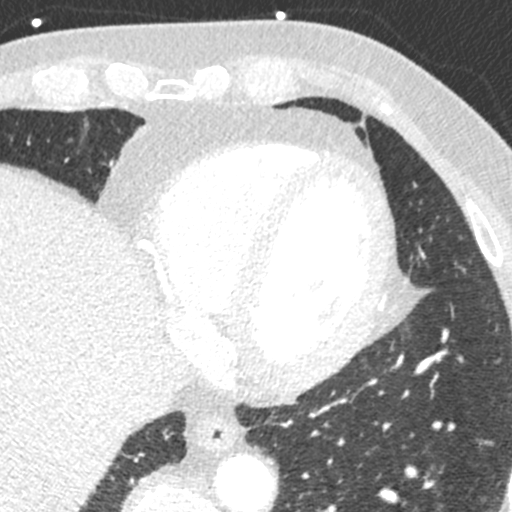
[im 211/316  lung]
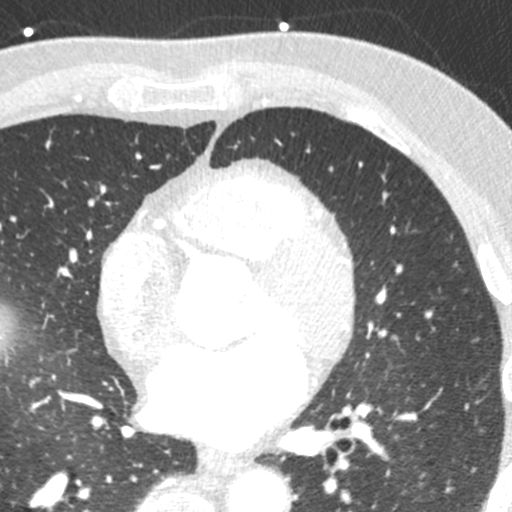

[Series 9: ts syst sharp · axial · 0.39mm/px · z∈[-248,-206]mm · 2 of 316 slices shown]
[im 106/316  lung]
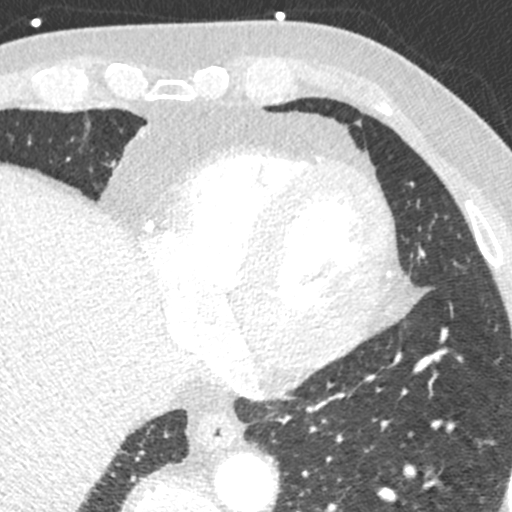
[im 211/316  lung]
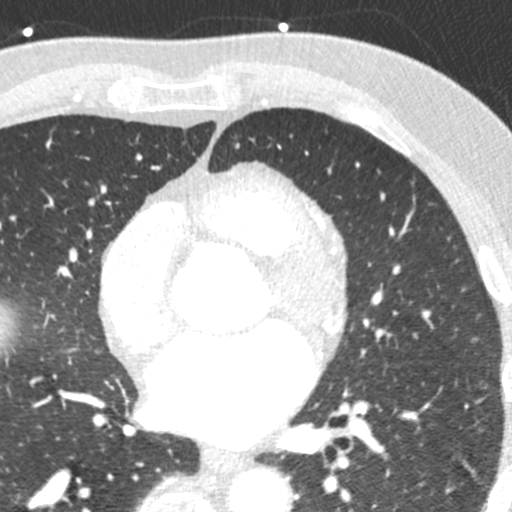

[8 of 20 positions shown; findings below may reference images not displayed]



Aorta: Normal size. Scattered calcifications in the ascending aorta.
No dissection.

Aortic Valve:  Trileaflet.  No calcifications.

Coronary Arteries:  Normal coronary origin.  Right dominance.

RCA is a large dominant artery that gives rise to PDA and PLVB.
There is minimal calcified plaque in the proximal RCA with
associated stenosis of 0-24%.

Left main is a large artery that gives rise to LAD and LCX arteries.
There is no plaque.

LAD is a large vessel that gives rise to a large D1. There is
moderate mixed plaque in the mid LAD at the takeoff of a large D1
and then further down in the mid LAD with associated stenosis of
50-69%. There is minimal scattered plaque in the diagonal with
associated stenosis of 0-24%.

LCX is a non-dominant artery that gives rise to one large OM1
branch. There is mild calcified plaque in the proximal OM1 with
associated stenosis of 25-49%.

Other findings:

Normal pulmonary vein drainage into the left atrium.

Normal let atrial appendage without a thrombus.

Normal size of the pulmonary artery.
IMPRESSION: 1. Coronary calcium score of 178. This was 70th percentile for age
and sex matched control.

2.  Normal coronary origin with right dominance.

3.  Moderate atherosclerosis of LAD.  CAD RADS 3.

4. Consider symptom-guided anti-ischemic and preventive
pharmacotherapy as well as risk factor modification per
guideline-directed care.

5.  This study has been submitted for FFR analysis.

Tou Fik Ac

EXAM:
OVER-READ INTERPRETATION  CT CHEST

The following report is an over-read performed by radiologist Dr.
over-read does not include interpretation of cardiac or coronary
anatomy or pathology. The coronary calcium score and cardiac CTA
interpretation by the cardiologist is attached.
FINDINGS: Aortic atherosclerosis. Mild linear scarring in the anterior aspect
of the right lower lobe. Within the visualized portions of the
thorax there are no suspicious appearing pulmonary nodules or
masses, there is no acute consolidative airspace disease, no pleural
effusions, no pneumothorax and no lymphadenopathy. Visualized
portions of the upper abdomen are unremarkable. There are no
aggressive appearing lytic or blastic lesions noted in the
visualized portions of the skeleton.
IMPRESSION: 1.  Aortic Atherosclerosis ([id number]-KH0.0).

*** End of Addendum ***
EXAM:
Cardiac/Coronary  CT
FINDINGS: A 120 kV prospective scan was triggered in the descending thoracic
aorta at 111 HU's. Axial non-contrast 3 mm slices were carried out
through the heart. The data set was analyzed on a dedicated work
station and scored using the Agatson method. Gantry rotation speed
was 250 msecs and collimation was .6 mm. No beta blockade and 0.8 mg
of sl NTG was given. The 3D data set was reconstructed in 5%
intervals of the 67-82 % of the R-R cycle. Diastolic phases were
analyzed on a dedicated work station using MPR, MIP and VRT modes.
The patient received 80 cc of contrast.

Aorta: Normal size. Scattered calcifications in the ascending aorta.
No dissection.

Aortic Valve:  Trileaflet.  No calcifications.

Coronary Arteries:  Normal coronary origin.  Right dominance.

RCA is a large dominant artery that gives rise to PDA and PLVB.
There is minimal calcified plaque in the proximal RCA with
associated stenosis of 0-24%.

Left main is a large artery that gives rise to LAD and LCX arteries.
There is no plaque.

LAD is a large vessel that gives rise to a large D1. There is
moderate mixed plaque in the mid LAD at the takeoff of a large D1
and then further down in the mid LAD with associated stenosis of
50-69%. There is minimal scattered plaque in the diagonal with
associated stenosis of 0-24%.

LCX is a non-dominant artery that gives rise to one large OM1
branch. There is mild calcified plaque in the proximal OM1 with
associated stenosis of 25-49%.

Other findings:

Normal pulmonary vein drainage into the left atrium.

Normal let atrial appendage without a thrombus.

Normal size of the pulmonary artery.
IMPRESSION: 1. Coronary calcium score of 178. This was 70th percentile for age
and sex matched control.

2.  Normal coronary origin with right dominance.

3.  Moderate atherosclerosis of LAD.  CAD RADS 3.

4. Consider symptom-guided anti-ischemic and preventive
pharmacotherapy as well as risk factor modification per
guideline-directed care.

5.  This study has been submitted for FFR analysis.

Tou Fik Ac

## 2021-09-20 ENCOUNTER — Other Ambulatory Visit: Payer: Self-pay | Admitting: Cardiology

## 2021-10-06 ENCOUNTER — Telehealth: Payer: Self-pay

## 2021-10-06 DIAGNOSIS — E785 Hyperlipidemia, unspecified: Secondary | ICD-10-CM

## 2021-10-06 NOTE — Telephone Encounter (Signed)
Called the pt and ordered labs under clinic collect to schedule fasting labs but he prefers [facility] so I cancelled those and reorder using lab collect and the pt plans to get them completed asap fasting.

## 2021-10-06 NOTE — Telephone Encounter (Signed)
-----   Message from Ramond Dial, [place] sent at 10/06/2021  1:28 PM EST -----  ----- Message ----- From: Ramond Dial, RPH-CPP Sent: 10/06/2021   1:26 PM EST To: Ramond Dial, RPH-CPP  Please schedule for lipid labs ----- Message ----- From: Ramond Dial, RPH-CPP Sent: 10/02/2021  12:00 AM EST To: Ramond Dial, RPH-CPP  lipids

## 2021-10-12 ENCOUNTER — Telehealth: Payer: Self-pay | Admitting: Pharmacist

## 2021-10-12 LAB — HEPATIC FUNCTION PANEL
ALT: 27 IU/L (ref 0–44)
AST: 20 IU/L (ref 0–40)
Albumin: 4.1 g/dL (ref 3.8–4.8)
Alkaline Phosphatase: 54 IU/L (ref 44–121)
Bilirubin Total: 0.3 mg/dL (ref 0.0–1.2)
Bilirubin, Direct: <0.1 mg/dL (ref 0.00–0.40)
Total Protein: 7.1 g/dL (ref 6.0–8.5)

## 2021-10-12 LAB — LIPID PANEL
Chol/HDL Ratio: 3.4 ratio (ref 0.0–5.0)
Cholesterol, Total: 138 mg/dL (ref 100–199)
HDL: 41 mg/dL (ref >39)
LDL Chol Calc (NIH): 78 mg/dL (ref 0–99)
Triglycerides: 100 mg/dL (ref 0–149)
VLDL Cholesterol Cal: 19 mg/dL (ref 5–40)

## 2021-10-12 NOTE — Telephone Encounter (Signed)
Called patient and LVM. LDL is stable 78. LFT WNL. In the past discussed with patient being more agressive and increasing rosuvastatin from 5mg  every day to 5mg  alternating with 10mg  or adding zetia to rosuvastatin 5mg  daily. Patient declined at last check.

## 2021-10-13 MED ORDER — EZETIMIBE 10 MG PO TABS: 10.0000 mg | ORAL_TABLET | Freq: Every day | ORAL | 3 refills | Status: DC

## 2021-10-13 NOTE — Telephone Encounter (Signed)
Patient returned call. He is willing to start zetia 10mg  daily. Patient was not fasting for these past las which explains why his TG were higher than normal. Recheck lipids, apo B and LFT in 3 months

## 2021-10-21 ENCOUNTER — Encounter: Payer: Self-pay | Admitting: Cardiology

## 2021-10-22 ENCOUNTER — Other Ambulatory Visit: Payer: Self-pay | Admitting: Cardiology

## 2021-10-23 ENCOUNTER — Other Ambulatory Visit: Payer: Self-pay

## 2021-10-23 ENCOUNTER — Encounter: Payer: Self-pay | Admitting: Cardiology

## 2021-10-23 MED ORDER — PANTOPRAZOLE SODIUM 40 MG PO TBEC: 40.0000 mg | DELAYED_RELEASE_TABLET | Freq: Every day | ORAL | 0 refills | Status: DC

## 2021-10-23 MED ORDER — CLOPIDOGREL BISULFATE 75 MG PO TABS: 75.0000 mg | ORAL_TABLET | Freq: Every day | ORAL | 0 refills | Status: DC

## 2021-10-23 NOTE — Telephone Encounter (Signed)
Pt's medications were sent to pt's pharmacy as requested. Confirmation received.  

## 2021-11-19 ENCOUNTER — Other Ambulatory Visit: Payer: Self-pay

## 2021-11-19 ENCOUNTER — Encounter (HOSPITAL_BASED_OUTPATIENT_CLINIC_OR_DEPARTMENT_OTHER): Payer: Self-pay | Admitting: Emergency Medicine

## 2021-11-19 ENCOUNTER — Emergency Department (HOSPITAL_BASED_OUTPATIENT_CLINIC_OR_DEPARTMENT_OTHER)
Admission: EM | Admit: 2021-11-19 | Discharge: 2021-11-19 | Disposition: A | Discharge status: Home / Self Care | Payer: Managed Care, Other (non HMO) | Attending: Emergency Medicine | Admitting: Emergency Medicine

## 2021-11-19 DIAGNOSIS — R21 Rash and other nonspecific skin eruption: Secondary | ICD-10-CM | POA: Diagnosis not present

## 2021-11-19 DIAGNOSIS — Z20822 Contact with and (suspected) exposure to covid-19: Secondary | ICD-10-CM | POA: Diagnosis not present

## 2021-11-19 DIAGNOSIS — Z7982 Long term (current) use of aspirin: Secondary | ICD-10-CM | POA: Insufficient documentation

## 2021-11-19 DIAGNOSIS — Z7902 Long term (current) use of antithrombotics/antiplatelets: Secondary | ICD-10-CM | POA: Diagnosis not present

## 2021-11-19 LAB — COMPREHENSIVE METABOLIC PANEL
ALT: 26 U/L (ref 0–44)
AST: 26 U/L (ref 15–41)
Albumin: 3.5 g/dL (ref 3.5–5.0)
Alkaline Phosphatase: 29 U/L — ABNORMAL LOW (ref 38–126)
Anion gap: 6 (ref 5–15)
BUN: 12 mg/dL (ref 8–23)
CO2: 26 mmol/L (ref 22–32)
Calcium: 8.5 mg/dL — ABNORMAL LOW (ref 8.9–10.3)
Chloride: 104 mmol/L (ref 98–111)
Creatinine, Ser: 0.85 mg/dL (ref 0.61–1.24)
GFR, Estimated: >60 mL/min (ref >60)
Glucose, Bld: 98 mg/dL (ref 70–99)
Potassium: 4 mmol/L (ref 3.5–5.1)
Sodium: 136 mmol/L (ref 135–145)
Total Bilirubin: 0.5 mg/dL (ref 0.3–1.2)
Total Protein: 6.6 g/dL (ref 6.5–8.1)

## 2021-11-19 LAB — RESP PANEL BY RT-PCR (FLU A&B, COVID) ARPGX2
Influenza A by PCR: NEGATIVE
Influenza B by PCR: NEGATIVE
SARS Coronavirus 2 by RT PCR: NEGATIVE

## 2021-11-19 LAB — CBC WITH DIFFERENTIAL/PLATELET
Abs Immature Granulocytes: 0.01 10*3/uL (ref 0.00–0.07)
Basophils Absolute: 0 10*3/uL (ref 0.0–0.1)
Basophils Relative: 1 %
Eosinophils Absolute: 0.2 10*3/uL (ref 0.0–0.5)
Eosinophils Relative: 6 %
HCT: 39.3 % (ref 39.0–52.0)
Hemoglobin: 13.6 g/dL (ref 13.0–17.0)
Immature Granulocytes: 0 %
Lymphocytes Relative: 31 %
Lymphs Abs: 1.2 10*3/uL (ref 0.7–4.0)
MCH: 31.6 pg (ref 26.0–34.0)
MCHC: 34.6 g/dL (ref 30.0–36.0)
MCV: 91.4 fL (ref 80.0–100.0)
Monocytes Absolute: 0.4 10*3/uL (ref 0.1–1.0)
Monocytes Relative: 11 %
Neutro Abs: 2 10*3/uL (ref 1.7–7.7)
Neutrophils Relative %: 51 %
Platelets: 128 10*3/uL — ABNORMAL LOW (ref 150–400)
RBC: 4.3 MIL/uL (ref 4.22–5.81)
RDW: 12.2 % (ref 11.5–15.5)
WBC: 3.8 10*3/uL — ABNORMAL LOW (ref 4.0–10.5)
nRBC: 0 % (ref 0.0–0.2)

## 2021-11-19 LAB — TROPONIN I (HIGH SENSITIVITY): Troponin I (High Sensitivity): <2 ng/L (ref <18)

## 2021-11-19 LAB — LIPASE, BLOOD: Lipase: 41 U/L (ref 11–51)

## 2021-11-19 MED ORDER — HYDROXYZINE HCL 25 MG PO TABS: 25.0000 mg | ORAL_TABLET | Freq: Four times a day (QID) | ORAL | 0 refills | Status: DC | PRN

## 2021-11-19 MED ORDER — PREDNISONE 10 MG PO TABS: 40.0000 mg | ORAL_TABLET | Freq: Every day | ORAL | 0 refills | Status: AC

## 2021-11-19 NOTE — ED Provider Notes (Signed)
[facility] EMERGENCY DEPARTMENT Provider Note   CSN: [id number] Arrival date & time: 11/19/21  6381     History  Chief Complaint  Patient presents with   Urticaria    Ronald Tapia is a 66 y.o. male.  66 yo gentleman presents with diffuse rash and fever. 3.5 weeks ago patient had a lesion on his penis (monogamous with wife) and saw urology, RPR, GC, HIV negative. He was treated for balanitis with bactrim and cipro for 10 days and lesions completely resolved. He completed the antibiotics on 11/15/21 and then on the evening of 11/16/21 he noticed a rash develop on his thigh that then diffusely spread over his entire body. He also had a temperature of 101.3*F. He took benadryl once on Thursday night that helped, but he  hasn't taken any since then. He saw his PCP on Friday and PCR for flu and COVID were negative. He presented today as his rash has persisted. He denies any cough, runny nose, sore throat, CP, SOB, n/v/d. 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 He regularly follows with urology, no change in testicles. PMH is significant for marginal zone lymphoma s/p rituximab tx in 2020. He saw his oncologist on 11/13/21 and has no sign of recurrence on imaging, was decreased to annual surveillance.      Home Medications Prior to Admission medications   Medication Sig Start Date End Date Taking? Authorizing Provider  hydrOXYzine (ATARAX) 25 MG tablet Take 1-2 tablets (25-50 mg total) by mouth every 6 (six) hours as needed for itching. 11/19/21  Yes Gareth Morgan, MD  predniSONE (DELTASONE) 10 MG tablet Take 4 tablets (40 mg total) by mouth daily for 4 days. 11/19/21 11/23/21 Yes Gareth Morgan, MD  acetaminophen (TYLENOL) 650 MG CR tablet Take 1,300 mg by mouth 2 (two) times daily as needed for pain.    [provider]  amLODipine (NORVASC) 2.5 MG tablet TAKE 1 TABLET DAILY 09/20/21   Sueanne Margarita, MD  amoxicillin-clavulanate (AUGMENTIN) 875-125 MG tablet Take 1 tablet by mouth every 12 (twelve) hours. 06/11/21   Fredia Sorrow, MD  ascorbic acid (VITAMIN C) 500 MG tablet Take 500 mg by mouth 2 (two) times daily.    [provider]  aspirin EC 81 MG tablet Take 81 mg by mouth daily. Swallow whole.    [provider]  Cholecalciferol (VITAMIN D3 PO) Take 7,500 Units by mouth every evening. 75000IU    [provider]  clopidogrel (PLAVIX) 75 MG tablet Take 1 tablet (75 mg total) by mouth daily with breakfast. Please make yearly appt with Dr. Radford Pax for April 2023 before anymore refills. Thank you 1st attempt 10/23/21   Sueanne Margarita, MD  Digestive Enzymes (DIGESTIVE ENZYME PO) Take 1 tablet by mouth 2 (two) times daily.    [provider]  ezetimibe (ZETIA) 10 MG tablet Take 1 tablet (10 mg total) by mouth daily. 10/13/21   Sueanne Margarita, MD  fluticasone (FLONASE) 50 MCG/ACT nasal spray Place 1 spray into both nostrils 2 (two) times daily as needed for rhinitis (congestion).    [provider]  gabapentin (NEURONTIN) 300 MG capsule Take 600 mg by mouth 3 (three) times daily.    [provider]  Melatonin 10 MG TABS Take 10 mg by mouth at bedtime.    [provider]  nitroGLYCERIN (NITROSTAT) 0.4 MG SL tablet Place 1 tablet (0.4 mg total) under the  tongue every 5 (five) minutes x 3 doses as needed for chest pain. 12/12/20   Sueanne Margarita, MD  OVER THE COUNTER MEDICATION Take 1,000 mg by mouth 3 (three) times daily. Beet Root    [provider]  pantoprazole (PROTONIX) 40 MG tablet Take 1 tablet (40 mg total) by mouth daily at 6 (six) AM. 10/23/21   Turner, Eber Hong, MD  rosuvastatin (CRESTOR) 5 MG tablet Take 1 tablet (5 mg total) by mouth daily. 05/31/21   Sueanne Margarita, MD      Allergies    Imdur [isosorbide nitrate], Amitriptyline, Keflex [cephalexin], and Topiramate    Review of Systems   Review of Systems   Constitutional:  Positive for fever. Negative for appetite change and chills.  HENT:  Negative for congestion, rhinorrhea and sore throat.   Respiratory:  Negative for cough and shortness of breath.   Cardiovascular:  Negative for chest pain.  Gastrointestinal:  Negative for abdominal pain, diarrhea, nausea and vomiting.  Musculoskeletal:  Negative for myalgias.  Skin:  Positive for rash.  Neurological:  Negative for dizziness.  All other systems reviewed and are negative.  Physical Exam Updated Vital Signs BP (!) 124/93    Pulse 71    Temp 97.9 F (36.6 C) (Oral)    Resp 16    SpO2 97%  Physical Exam Vitals and nursing note reviewed.  Constitutional:      General: He is not in acute distress.    Appearance: Normal appearance. He is normal weight. He is ill-appearing. He is not toxic-appearing or diaphoretic.  HENT:     Head: Normocephalic and atraumatic.     Nose: Nose normal.     Mouth/Throat:     Mouth: Mucous membranes are moist.     Pharynx: Oropharynx is clear. No oropharyngeal exudate or posterior oropharyngeal erythema.  Eyes:     Extraocular Movements: Extraocular movements intact.     Conjunctiva/sclera: Conjunctivae normal.     Pupils: Pupils are equal, round, and reactive to light.  Neck:     Comments: Left thyroid mass palpable Cardiovascular:     Rate and Rhythm: Normal rate and regular rhythm.     Pulses: Normal pulses.     Heart sounds: Normal heart sounds.  Pulmonary:     Effort: Pulmonary effort is normal.     Breath sounds: Normal breath sounds.  Abdominal:     General: Abdomen is flat. Bowel sounds are normal.     Palpations: Abdomen is soft.  Genitourinary:    Penis: Normal.      Comments: No lesions on penis. R testicle atrophied, L scrotum with spermatocele Musculoskeletal:     Cervical back: Normal range of motion and neck supple. No rigidity or tenderness.     Right lower leg: No edema.     Left lower leg: No edema.  Lymphadenopathy:      Cervical: No cervical adenopathy.  Skin:    General: Skin is warm and dry.     Capillary Refill: Capillary refill takes less than 2 seconds.     Comments: Diffuse morbiliform rash from shoulder to knee  Neurological:     General: No focal deficit present.     Mental Status: He is alert and oriented to person, place, and time. Mental status is at baseline.    ED Results / Procedures / Treatments   Labs (all labs ordered are listed, but only abnormal results are displayed) Labs Reviewed  CBC WITH DIFFERENTIAL/PLATELET - Abnormal;  Notable for the following components:      Result Value   WBC 3.8 (*)    Platelets 128 (*)    All other components within normal limits  COMPREHENSIVE METABOLIC PANEL - Abnormal; Notable for the following components:   Calcium 8.5 (*)    Alkaline Phosphatase 29 (*)    All other components within normal limits  RESP PANEL BY RT-PCR (FLU A&B, COVID) ARPGX2  LIPASE, BLOOD  TROPONIN I (HIGH SENSITIVITY)  TROPONIN I (HIGH SENSITIVITY)    EKG EKG Interpretation  Date/Time:  _0 /12/23 1214              Gladys Damme, MD 11/19/21 1228    Gareth Morgan, MD 11/21/21 0031

## 2021-11-19 NOTE — Discharge Instructions (Addendum)
Your rash could be a reaction to your recent antibiotics, but it also could be from a virus. We recommend you take prednisone for 4 days and hydroxyzine for itching. Hydroxyzine may make you sleepy, do not take prior to driving.   If your rash does not improve, returns, you have more fever, please follow up with your primary care provider or return for evaluation.

## 2021-11-19 NOTE — ED Triage Notes (Signed)
Pt reports he began to get generalized hives on Thursday. Says this is the first time has happened. Recently started on cipro and bactrim. Denies sob or sensation of throat closing.

## 2021-12-14 ENCOUNTER — Encounter: Payer: Self-pay | Admitting: Cardiology

## 2021-12-19 ENCOUNTER — Other Ambulatory Visit: Payer: Self-pay | Admitting: Cardiology

## 2021-12-27 ENCOUNTER — Encounter: Payer: Self-pay | Admitting: Cardiology

## 2021-12-28 ENCOUNTER — Encounter: Payer: Self-pay | Admitting: Cardiology

## 2021-12-28 IMAGING — CR DG CHEST 2V
2 series · 2 of 2 positions shown · non-contrast
Comparison: CT 08/12/2020.  04/29/2020.

CLINICAL DATA: Chest pain.

EXAM:
CHEST - 2 VIEW

[chest pa]
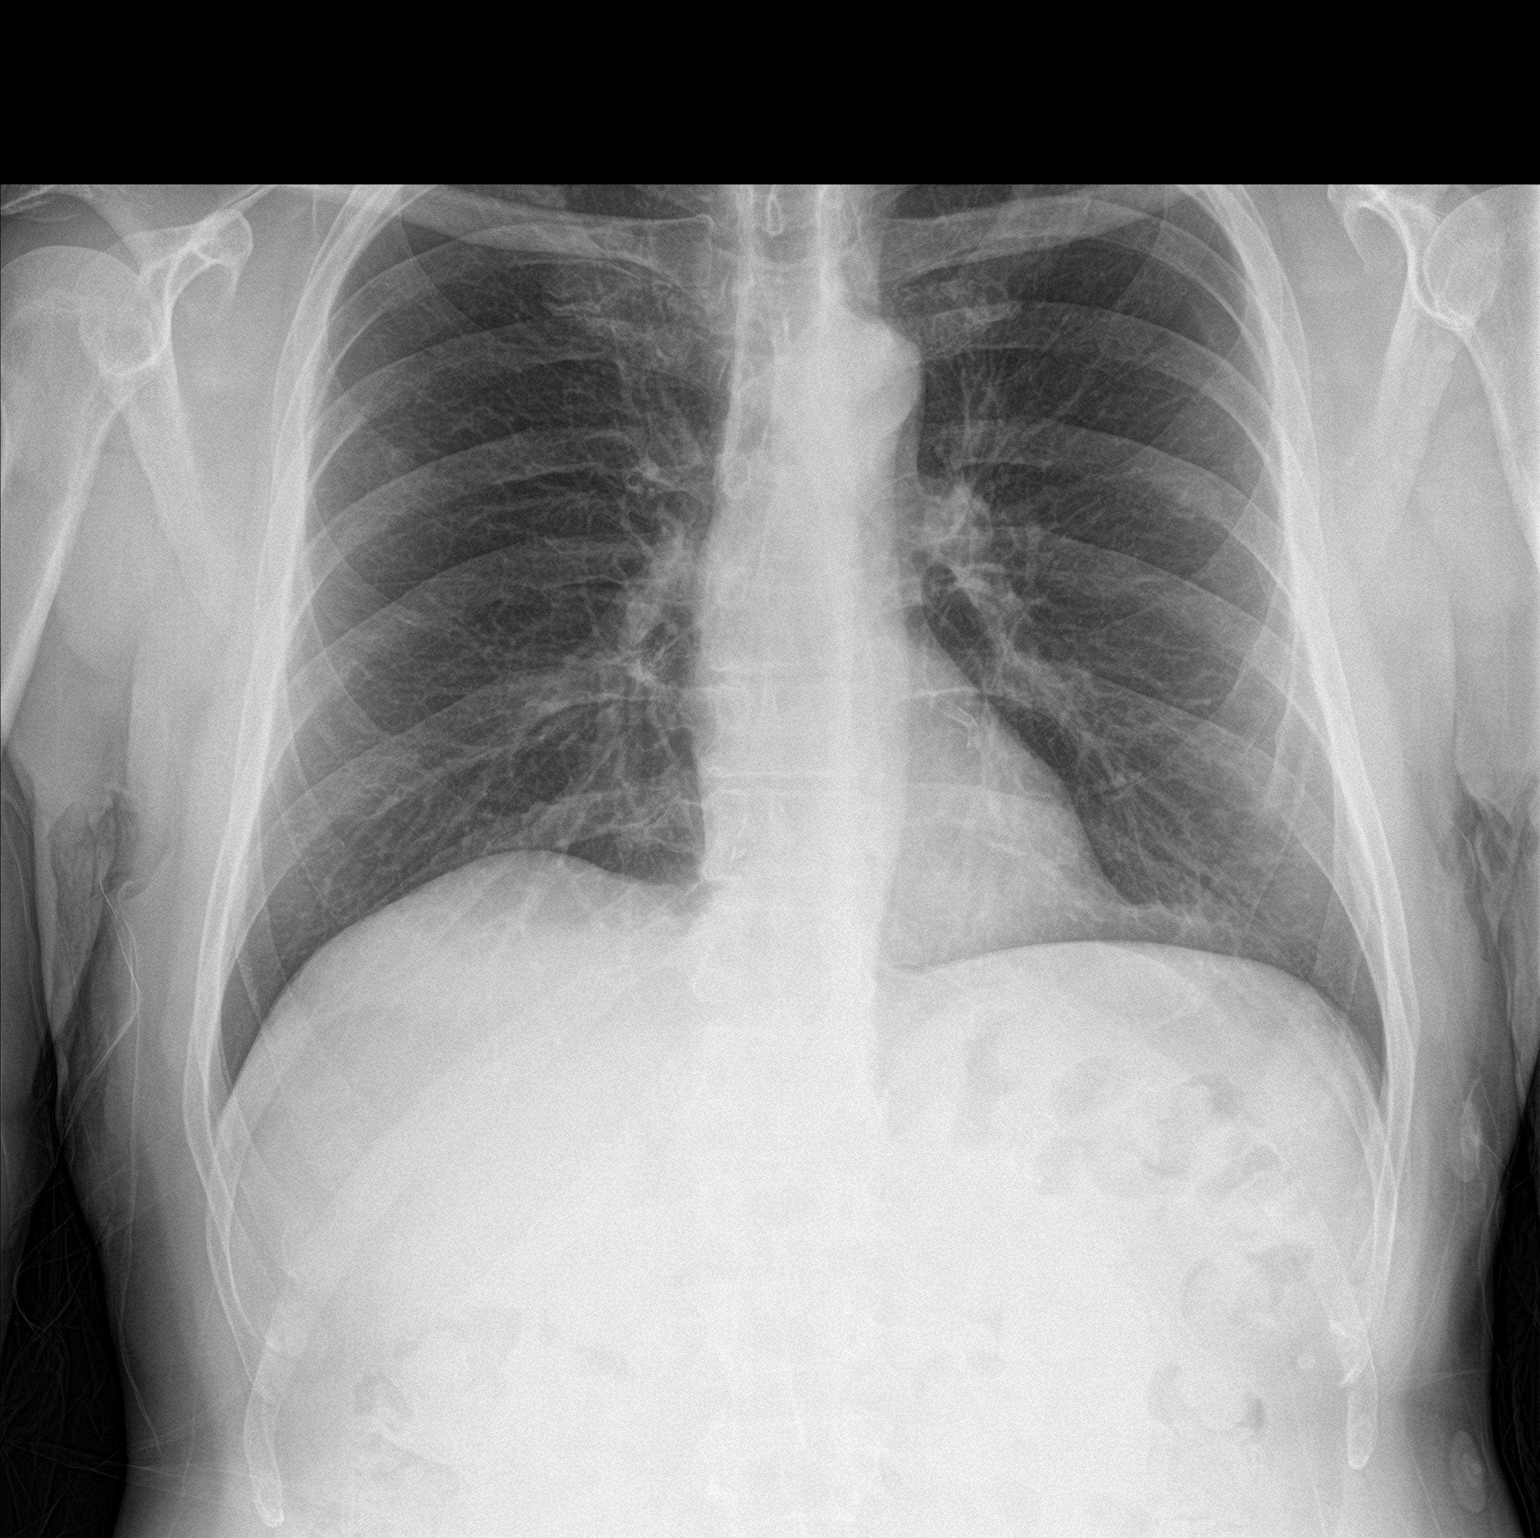

[chest lat]
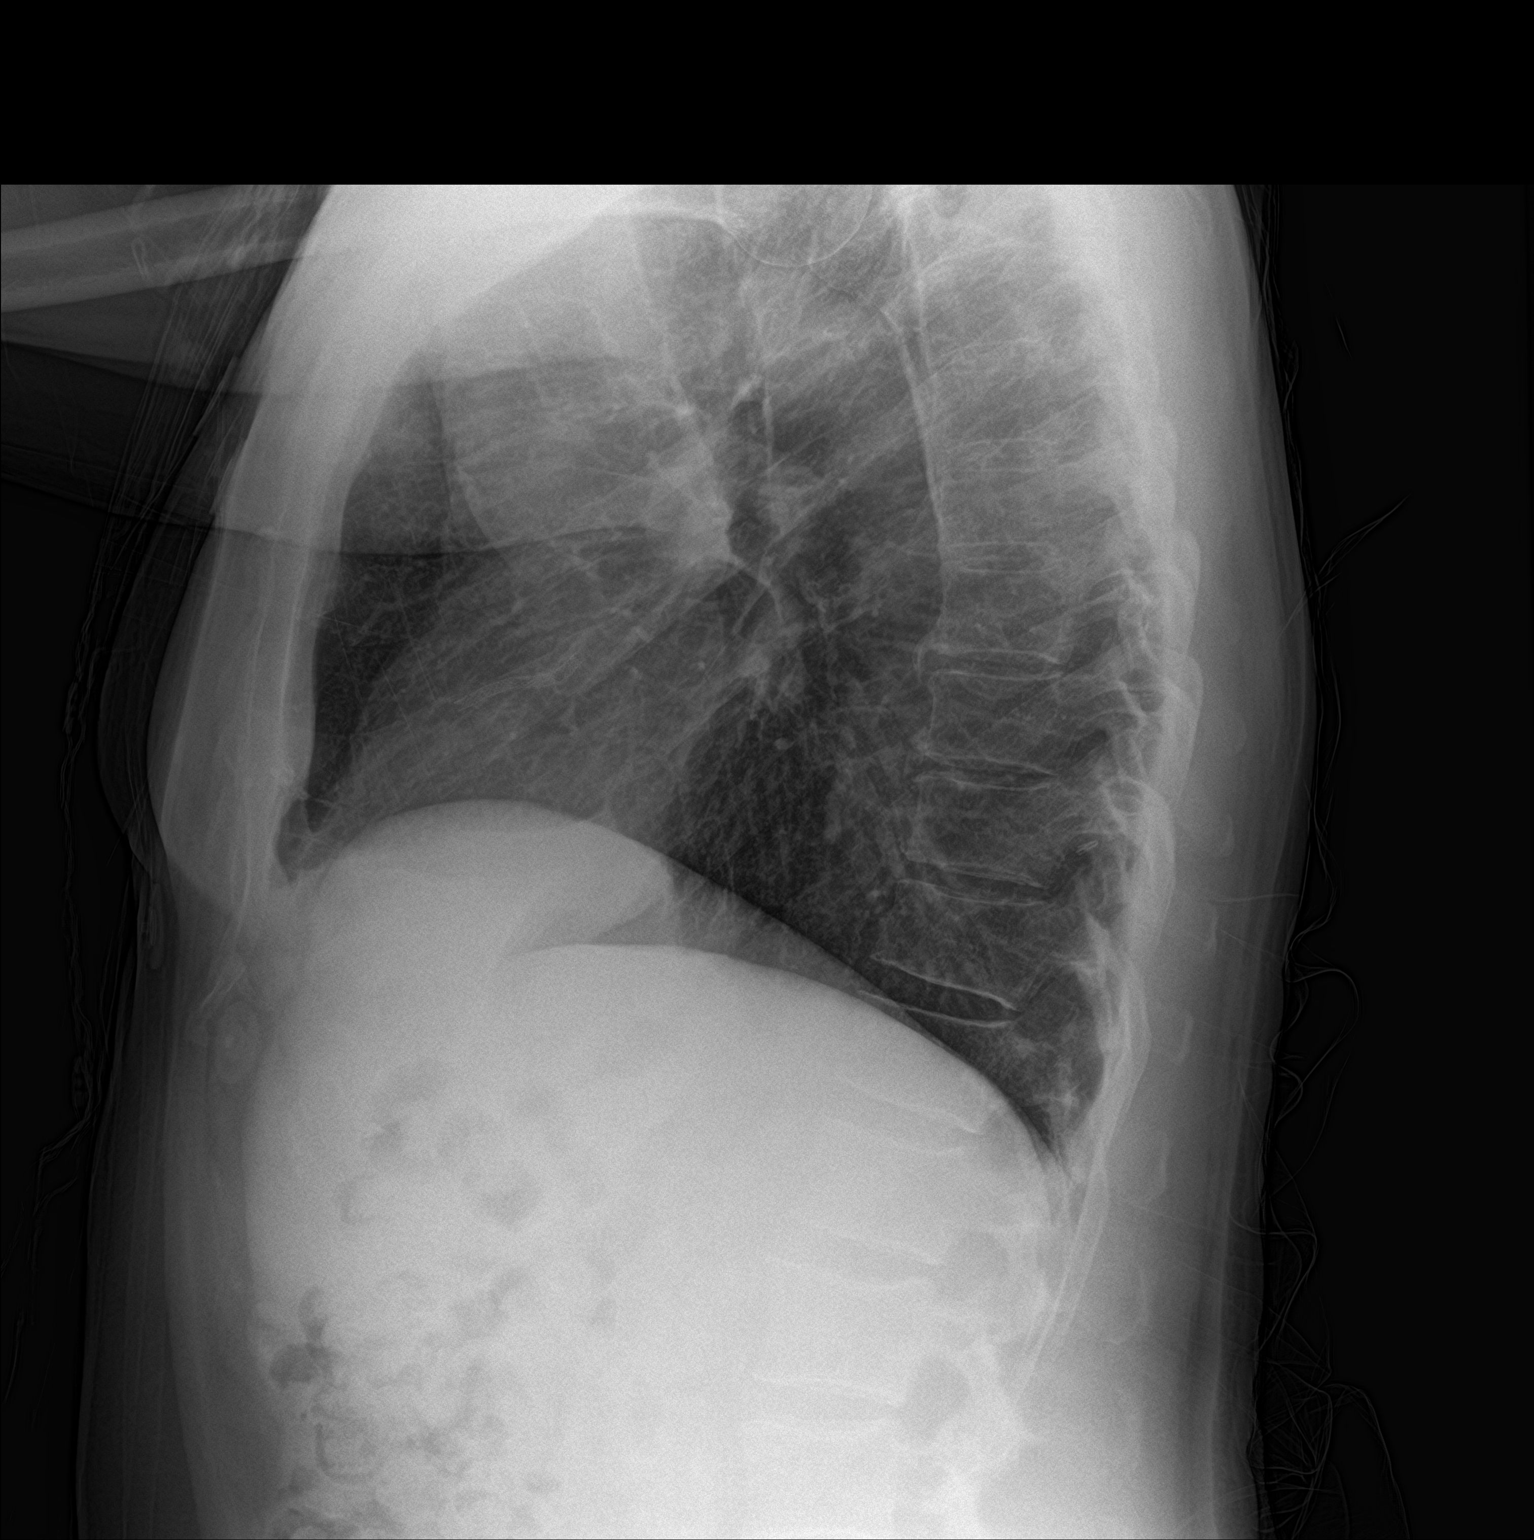

[2 of 2 positions shown; findings below may reference images not displayed]

FINDINGS: EKG pads noted over the chest. Mediastinum and hilar structures are
normal. Lungs are clear. No pleural effusion or pneumothorax. Heart
size normal. Coronary artery stents. No acute bony abnormality.
IMPRESSION: No acute cardiopulmonary disease. Coronary artery stents noted.
Heart size normal.

## 2021-12-29 MED ORDER — ROSUVASTATIN CALCIUM 5 MG PO TABS: 5.0000 mg | ORAL_TABLET | Freq: Every day | ORAL | 3 refills | Status: DC

## 2022-01-03 ENCOUNTER — Other Ambulatory Visit: Payer: Self-pay | Admitting: Cardiology

## 2022-01-08 ENCOUNTER — Telehealth: Payer: Self-pay

## 2022-01-08 DIAGNOSIS — E785 Hyperlipidemia, unspecified: Secondary | ICD-10-CM

## 2022-01-08 DIAGNOSIS — I25118 Atherosclerotic heart disease of native coronary artery with other forms of angina pectoris: Secondary | ICD-10-CM

## 2022-01-08 NOTE — Telephone Encounter (Signed)
Called and spoke to pt who stated that they prefer to do at [facility] orders placed and released ?

## 2022-01-08 NOTE — Telephone Encounter (Signed)
-----   Message from Ramond Dial, [place] sent at 01/08/2022  2:50 PM EDT ----- ? ?----- Message ----- ?From: Ramond Dial, RPH-CPP ?Sent: 01/05/2022  12:00 AM EDT ?To: Ramond Dial, RPH-CPP ? ?Set up lipid, abo b and lft ? ? ?

## 2022-01-09 ENCOUNTER — Encounter: Payer: Self-pay | Admitting: Cardiology

## 2022-01-13 LAB — HEPATIC FUNCTION PANEL
ALT: 38 IU/L (ref 0–44)
AST: 30 IU/L (ref 0–40)
Albumin: 4.5 g/dL (ref 3.8–4.8)
Alkaline Phosphatase: 49 IU/L (ref 44–121)
Bilirubin Total: 0.4 mg/dL (ref 0.0–1.2)
Bilirubin, Direct: 0.14 mg/dL (ref 0.00–0.40)
Total Protein: 7.1 g/dL (ref 6.0–8.5)

## 2022-01-13 LAB — LIPID PANEL
Chol/HDL Ratio: 2.6 ratio (ref 0.0–5.0)
Cholesterol, Total: 119 mg/dL (ref 100–199)
HDL: 46 mg/dL (ref >39)
LDL Chol Calc (NIH): 57 mg/dL (ref 0–99)
Triglycerides: 77 mg/dL (ref 0–149)
VLDL Cholesterol Cal: 16 mg/dL (ref 5–40)

## 2022-01-13 LAB — APOLIPOPROTEIN B: Apolipoprotein B: 59 mg/dL (ref <90)

## 2022-01-19 ENCOUNTER — Encounter: Payer: Self-pay | Admitting: Cardiology

## 2022-02-07 ENCOUNTER — Encounter: Payer: Self-pay | Admitting: Cardiology

## 2022-02-07 ENCOUNTER — Ambulatory Visit: Payer: Managed Care, Other (non HMO) | Admitting: Cardiology

## 2022-02-07 VITALS — BP 150/92 | HR 84 | Ht 69.0 in | Wt 192.0 lb

## 2022-02-07 DIAGNOSIS — I25118 Atherosclerotic heart disease of native coronary artery with other forms of angina pectoris: Secondary | ICD-10-CM

## 2022-02-07 DIAGNOSIS — I10 Essential (primary) hypertension: Secondary | ICD-10-CM | POA: Diagnosis not present

## 2022-02-07 DIAGNOSIS — E7800 Pure hypercholesterolemia, unspecified: Secondary | ICD-10-CM

## 2022-02-07 MED ORDER — CLOPIDOGREL BISULFATE 75 MG PO TABS: 75.0000 mg | ORAL_TABLET | Freq: Every day | ORAL | 3 refills | Status: DC

## 2022-02-07 NOTE — Progress Notes (Signed)
? ? ?Date:  02/07/2022  ? ?ID:  Ronald Tapia, DOB August 13, 1956, MRN [id number] ?The patient was identified using 2 identifiers. ? ? ?PCP:  Osie Cheeks, PA-C  ?Cardiologist:  Fransico Him, MD  ?Electrophysiologist:  None  ? ?Evaluation Performed:  Follow-Up Visit ? ?Chief Complaint:  CAD, HTN, HLD ? ?History of Present Illness:   ? ?Ronald Tapia is a 66 y.o. male with with history of CAD (s/p PCI of the LAD on 04/25/2020), lymphoma, 2-year history of debilitating headaches ultimately found to be due to CSF leak s/p repair at [facility], GERD, borderline hypertension, hyperlipidemia, & aortic atherosclerosis. ?  ?He was admitted for chest pain in July 2021. Echo showed normal LV function without any significant valvular abnormalities. Coronary CT angiography showed possible significant stenosis in the mid LAD with an FFR of 0.72 in the mid vessel and 0.61 in the distal vessel and aortic atherosclerosis. Cath showed severe 90% proximal to mid LAD stenosis and 75% mid LAD stenosis, coronaries otherwise normal. He underwent successful PCI and stenting of the LAD. He was readmitted 7/23-7/24/21 with recurrent chest pain without significant change with NTG. There was an area just above and lateral to his left breast which was slightly tender. His workup was benign with normal sed rate, normal EKG, and completely normal enzymes. His P2Y12 was 71 indicating adequate Plavix response. He was continued on home gabapentin and started on Imdur/Voltaren empirically. ?  ?He was seen back in clinic by Melina Copa, PA in August and was doing well. He was walking regularly and was up to 12 minutes twice daily. He would still notice occasional chest discomfort on that same side, worse upon waking up in the morning, specifically on the side he sleeps on and actually would feel better when he walked. He stopped Imdur due to headaches and lack of clinical benefit.   His atorvastatin was reduced due to possible SE of parasthesias,  sluggishness and cold intolerance.  Amlodipine low dose was added for better BP control.   ? ?He was back in the hospital in Dec 2021 in the ER with atypical CP reproducible with chest wall palpation and felt to be MSK and resolved.   ? ?He is here today for followup and is doing well.  He denies any chest pain or pressure, SOB, DOE, PND, orthopnea, LE edema, dizziness, palpitations or syncope. He is compliant with his meds and is tolerating meds with no SE.    ? ?Past Medical History:  ?Diagnosis Date  ? Anxiety   ? Aortic atherosclerosis ([place])   ? CAD (coronary artery disease)   ? a. 04/2020 Cor CTA: mid LAD dzs (FFR 0.20m 0.61d); b. 04/2020 PCI: LM nl, LAD 90p/m, [id number] Onyx DES), LCX nl, RCA nl.  ? [place] ? CSF leak   ? CSF fistula, s/p repair, [facility]   ? GERD (gastroesophageal reflux disease) 04/24/2020  ? Headache   ? History of echocardiogram   ? a. 04/2020 Echo: EF 65-70%, no rwma, triv MR/AI.  ? Hyperlipidemia 04/24/2020  ? [place] ? White coat syndrome without hypertension   ? ?Past Surgical History:  ?Procedure Laterality Date  ? BACK SURGERY    ? CORONARY STENT INTERVENTION N/A 04/25/2020  ? Procedure: CORONARY STENT INTERVENTION;  Surgeon: BerLorretta HarpD;  Location: [facility];  Service: Cardiovascular;  Laterality: N/A;  ? LEFT HEART CATH AND CORONARY ANGIOGRAPHY N/A 04/25/2020  ?  Procedure: LEFT HEART CATH AND CORONARY ANGIOGRAPHY;  Surgeon: Lorretta Harp, MD;  Location: [facility];  Service: Cardiovascular;  Laterality: N/A;  ?  ? ?Current Meds  ?Medication Sig  ? acetaminophen (TYLENOL) 650 MG CR tablet Take 1,300 mg by mouth 2 (two) times daily as needed for pain.  ? amLODipine (NORVASC) 2.5 MG tablet Take 1 tablet (2.5 mg total) by mouth daily. Please keep upcoming appt for future refills.  ? ascorbic acid (VITAMIN C) 500 MG tablet Take 500 mg by mouth 2 (two) times daily.  ? aspirin EC 81 MG tablet Take 81  mg by mouth daily. Swallow whole.  ? Cholecalciferol (VITAMIN D3 PO) Take 7,500 Units by mouth every evening. 75000IU  ? clopidogrel (PLAVIX) 75 MG tablet Take 1 tablet (75 mg total) by mouth daily with breakfast. Please make yearly appt with Dr. Radford Pax for April 2023 before anymore refills. Thank you 1st attempt  ? Digestive Enzymes (DIGESTIVE ENZYME PO) Take 1 tablet by mouth 2 (two) times daily.  ? ezetimibe (ZETIA) 10 MG tablet Take 1 tablet (10 mg total) by mouth daily.  ? fluticasone (FLONASE) 50 MCG/ACT nasal spray Place 1 spray into both nostrils 2 (two) times daily as needed for rhinitis (congestion).  ? gabapentin (NEURONTIN) 300 MG capsule Take 600 mg by mouth 3 (three) times daily.  ? Melatonin 10 MG TABS Take 10 mg by mouth at bedtime.  ? nitroGLYCERIN (NITROSTAT) 0.4 MG SL tablet Place 1 tablet (0.4 mg total) under the tongue every 5 (five) minutes x 3 doses as needed for chest pain.  ? OVER THE COUNTER MEDICATION Take 1,000 mg by mouth 3 (three) times daily. Beet Root  ? pantoprazole (PROTONIX) 40 MG tablet Take 1 tablet (40 mg total) by mouth daily at 6 PM.  ? rosuvastatin (CRESTOR) 5 MG tablet Take 1 tablet (5 mg total) by mouth daily.  ?  ? ?Allergies:   Sulfamethoxazole-trimethoprim, Imdur [isosorbide nitrate], Amitriptyline, Keflex [cephalexin], and Topiramate  ? ?Social History  ? ?Tobacco Use  ? Smoking status: Never  ? Smokeless tobacco: Never  ?Vaping Use  ? Vaping Use: Never used  ?Substance Use Topics  ? Alcohol use: Not Currently  ? Drug use: Never  ?  ? ?Family Hx: ?The patient's family history includes Breast cancer in his sister; Diabetes Mellitus II in his father; Heart attack in his father and maternal grandfather; Hypertension in his father; Kidney cancer in his brother; Parkinson's disease in an other family member. ? ?ROS:   ?Please see the history of present illness.    ?none ?All other systems reviewed and are negative. ? ? ?Prior CV studies:   ?The following studies were  reviewed today: ? ?EKG ? ?Labs/Other Tests and Data Reviewed:   ? ?EKG:  Not done today ? ?Recent Labs: ?11/19/2021: BUN 12; Creatinine, Ser 0.85; Hemoglobin 13.6; Platelets 128; Potassium 4.0; Sodium 136 ?01/12/2022: ALT 38  ? ?Recent Lipid Panel ?Lab Results  ?Component Value Date/Time  ? CHOL 119 01/12/2022 09:02 AM  ? TRIG 77 01/12/2022 09:02 AM  ? HDL 46 01/12/2022 09:02 AM  ? CHOLHDL 2.6 01/12/2022 09:02 AM  ? CHOLHDL 5.9 04/24/2020 05:32 AM  ? LDLCALC 57 01/12/2022 09:02 AM  ? ? ?Wt Readings from Last 3 Encounters:  ?02/07/22 192 lb (87.1 kg)  ?06/11/21 182 lb (82.6 kg)  ?01/24/21 182 lb 12.8 oz (82.9 kg)  ?  ? ? ?Objective:   ? ?Vital Signs:  BP (!) 150/92  Pulse 84   Ht '5\' 9"'$  (1.753 m)   Wt 192 lb (87.1 kg)   BMI 28.35 kg/m?   ?GEN: Well nourished, well developed in no acute distress ?HEENT: Normal ?NECK: No JVD; No carotid bruits ?LYMPHATICS: No lymphadenopathy ?CARDIAC:RRR, no murmurs, rubs, gallops ?RESPIRATORY:  Clear to auscultation without rales, wheezing or rhonchi  ?ABDOMEN: Soft, non-tender, non-distended ?MUSCULOSKELETAL:  No edema; No deformity  ?SKIN: Warm and dry ?NEUROLOGIC:  Alert and oriented x 3 ?PSYCHIATRIC:  Normal affect   ?ASSESSMENT & PLAN:   ? ?1.  ASCAD ?-Coronary CT angiography showed possible significant stenosis in the mid LAD with an FFR of 0.72 in the mid vessel and 0.61 in the distal vessel and aortic atherosclerosis.  ?-Cath showed severe 90% proximal to mid LAD stenosis and 75% mid LAD stenosis, coronaries otherwise normal.  ?-He underwent successful PCI and stenting of the LAD. ?-continued to have chronic CP with negative workup after PCI and felt to possibly be related to possible GERD/MSK ?-he had an episode of vague CP in Feb 22 that occurred after he was in a hot shower early in the am and felt dizzy and had pre syncope and then had severe abdominal pain and then some CP that felt like his typical GERD pain.   ?-Since I saw him last he has had no further chest pain or  anginal symptoms. ?-Continue prescription drug management with aspirin 81 mg daily, Plavix 75 mg daily and statin therapy ? ?2.  HTN ?-BP is borderline controlled on exam today but has a headache today ?-Due to pre

## 2022-02-07 NOTE — Patient Instructions (Signed)

## 2022-02-07 NOTE — Addendum Note (Signed)
Addended by: Antonieta Iba on: 02/07/2022 03:02 PM ? ? Modules accepted: Orders ? ?

## 2022-03-08 ENCOUNTER — Other Ambulatory Visit: Payer: Self-pay

## 2022-03-08 MED ORDER — CLOPIDOGREL BISULFATE 75 MG PO TABS: 75.0000 mg | ORAL_TABLET | Freq: Every day | ORAL | 3 refills | Status: DC

## 2022-03-19 ENCOUNTER — Other Ambulatory Visit: Payer: Self-pay | Admitting: Cardiology

## 2022-04-03 ENCOUNTER — Other Ambulatory Visit: Payer: Self-pay | Admitting: Cardiology

## 2022-04-24 ENCOUNTER — Emergency Department (HOSPITAL_COMMUNITY)
Admission: EM | Admit: 2022-04-24 | Discharge: 2022-04-24 | Disposition: A | Discharge status: Home / Self Care | Payer: Managed Care, Other (non HMO) | Attending: Emergency Medicine | Admitting: Emergency Medicine

## 2022-04-24 ENCOUNTER — Encounter (HOSPITAL_COMMUNITY): Payer: Self-pay | Admitting: Emergency Medicine

## 2022-04-24 ENCOUNTER — Emergency Department (HOSPITAL_COMMUNITY): Payer: Managed Care, Other (non HMO)

## 2022-04-24 ENCOUNTER — Encounter (HOSPITAL_BASED_OUTPATIENT_CLINIC_OR_DEPARTMENT_OTHER): Payer: Self-pay | Admitting: Pediatrics

## 2022-04-24 ENCOUNTER — Other Ambulatory Visit: Payer: Self-pay

## 2022-04-24 ENCOUNTER — Emergency Department (HOSPITAL_BASED_OUTPATIENT_CLINIC_OR_DEPARTMENT_OTHER)
Admission: EM | Admit: 2022-04-24 | Discharge: 2022-04-24 | Disposition: A | Discharge status: Home / Self Care | Payer: Managed Care, Other (non HMO) | Source: Home / Self Care | Attending: Emergency Medicine | Admitting: Emergency Medicine

## 2022-04-24 DIAGNOSIS — Z79899 Other long term (current) drug therapy: Secondary | ICD-10-CM | POA: Insufficient documentation

## 2022-04-24 DIAGNOSIS — Z7982 Long term (current) use of aspirin: Secondary | ICD-10-CM | POA: Insufficient documentation

## 2022-04-24 DIAGNOSIS — I10 Essential (primary) hypertension: Secondary | ICD-10-CM | POA: Insufficient documentation

## 2022-04-24 DIAGNOSIS — R079 Chest pain, unspecified: Secondary | ICD-10-CM | POA: Insufficient documentation

## 2022-04-24 DIAGNOSIS — Z5321 Procedure and treatment not carried out due to patient leaving prior to being seen by health care provider: Secondary | ICD-10-CM | POA: Insufficient documentation

## 2022-04-24 DIAGNOSIS — R110 Nausea: Secondary | ICD-10-CM | POA: Diagnosis not present

## 2022-04-24 DIAGNOSIS — R0602 Shortness of breath: Secondary | ICD-10-CM | POA: Insufficient documentation

## 2022-04-24 DIAGNOSIS — I159 Secondary hypertension, unspecified: Secondary | ICD-10-CM

## 2022-04-24 LAB — BASIC METABOLIC PANEL
Anion gap: 9 (ref 5–15)
BUN: 12 mg/dL (ref 8–23)
CO2: 23 mmol/L (ref 22–32)
Calcium: 9.5 mg/dL (ref 8.9–10.3)
Chloride: 108 mmol/L (ref 98–111)
Creatinine, Ser: 0.9 mg/dL (ref 0.61–1.24)
GFR, Estimated: >60 mL/min (ref >60)
Glucose, Bld: 94 mg/dL (ref 70–99)
Potassium: 4 mmol/L (ref 3.5–5.1)
Sodium: 140 mmol/L (ref 135–145)

## 2022-04-24 LAB — CBC
HCT: 44.5 % (ref 39.0–52.0)
Hemoglobin: 15.3 g/dL (ref 13.0–17.0)
MCH: 32.4 pg (ref 26.0–34.0)
MCHC: 34.4 g/dL (ref 30.0–36.0)
MCV: 94.3 fL (ref 80.0–100.0)
Platelets: 164 10*3/uL (ref 150–400)
RBC: 4.72 MIL/uL (ref 4.22–5.81)
RDW: 12.2 % (ref 11.5–15.5)
WBC: 5.7 10*3/uL (ref 4.0–10.5)
nRBC: 0 % (ref 0.0–0.2)

## 2022-04-24 LAB — TROPONIN I (HIGH SENSITIVITY)
Troponin I (High Sensitivity): 4 ng/L (ref <18)
Troponin I (High Sensitivity): 2 ng/L (ref <18)

## 2022-04-24 NOTE — ED Triage Notes (Signed)
C/o L sided chest pain x 3 days with nausea and mild SOB.  Denies vomiting.

## 2022-04-24 NOTE — Discharge Instructions (Addendum)
Ambulatory referral sent to your cardiologist office.  If you do not hear from the next 3 to 5 days please call to make an appointment for stress test.  Please give a diary of blood pressures at breakfast lunch and dinner take to cardiology appointment with you.

## 2022-04-24 NOTE — ED Provider Triage Note (Signed)
Emergency Medicine Provider Triage Evaluation Note  Ronald Tapia , a 66 y.o. male  was evaluated in triage.  Pt complains of chest pain. States that same began 2 days ago and has been persistent since. Endorses some mild shortness of breath and nausea without vomiting. States that last time he felt similarly was 2 years ago when he had 2 stents places.  Review of Systems  Positive:  Negative:   Physical Exam  BP (!) 140/101 (BP Location: Right Arm)   Pulse 92   Temp 98.1 F (36.7 C) (Oral)   Resp 16   SpO2 94%  Gen:   Awake, no distress   Resp:  Normal effort  MSK:   Moves extremities without difficulty  Other:    Medical Decision Making  Medically screening exam initiated at 12:37 PM.  Appropriate orders placed.  Diezel Mazur was informed that the remainder of the evaluation will be completed by another provider, this initial triage assessment does not replace that evaluation, and the importance of remaining in the ED until their evaluation is complete.     Bud Face, PA-C 04/24/22 1238

## 2022-04-24 NOTE — ED Notes (Signed)
Patient left without being seen, states "We are going to the [facility] in [facility]."

## 2022-04-24 NOTE — ED Provider Notes (Signed)
[facility] EMERGENCY DEPARTMENT Provider Note   CSN: [id number] Arrival date & time: 04/24/22  1817     History  Chief Complaint  Patient presents with   Chest Pain    Denilson Salminen is a 66 y.o. male.  Patient is a 66 year old male with past medical history of hypertension, hyperlipidemia, and cardiac stents presenting for complaints of chest pain.  Patient admits to left-sided chest pain that is intermittent, nonradiating, severe originally but has now improved, that started on Saturday.  Patient states he was symptom-free on Sunday but symptoms reoccurred today.  Symptoms occur spontaneously at rest and with exertion.  Denies any fevers, chills, coughing.  Denies active smoking or wheezing, or history of COPD.  Denies orthopnea or lower extremity edema.  The history is provided by the patient. No language interpreter was used.  Chest Pain Associated symptoms: no abdominal pain, no back pain, no cough, no fever, no palpitations, no shortness of breath and no vomiting        Home Medications Prior to Admission medications   Medication Sig Start Date End Date Taking? Authorizing Provider  acetaminophen (TYLENOL) 650 MG CR tablet Take 1,300 mg by mouth 2 (two) times daily as needed for pain.    [provider]  amLODipine (NORVASC) 2.5 MG tablet Take 1 tablet (2.5 mg total) by mouth daily. 03/19/22   Sueanne Margarita, MD  ascorbic acid (VITAMIN C) 500 MG tablet Take 500 mg by mouth 2 (two) times daily.    [provider]  aspirin EC 81 MG tablet Take 81 mg by mouth daily. Swallow whole.    [provider]  Cholecalciferol (VITAMIN D3 PO) Take 7,500 Units by mouth every evening. 75000IU    [provider]  clopidogrel (PLAVIX) 75 MG tablet Take 1 tablet (75 mg total) by mouth daily with breakfast. 03/08/22   Sueanne Margarita, MD  Digestive Enzymes (DIGESTIVE ENZYME PO) Take 1 tablet by mouth 2 (two) times daily.    [provider]   ezetimibe (ZETIA) 10 MG tablet Take 1 tablet (10 mg total) by mouth daily. 10/13/21   Sueanne Margarita, MD  fluticasone (FLONASE) 50 MCG/ACT nasal spray Place 1 spray into both nostrils 2 (two) times daily as needed for rhinitis (congestion).    [provider]  gabapentin (NEURONTIN) 300 MG capsule Take 600 mg by mouth 3 (three) times daily.    [provider]  Melatonin 10 MG TABS Take 10 mg by mouth at bedtime.    [provider]  nitroGLYCERIN (NITROSTAT) 0.4 MG SL tablet Place 1 tablet (0.4 mg total) under the tongue every 5 (five) minutes x 3 doses as needed for chest pain. 12/12/20   Sueanne Margarita, MD  OVER THE COUNTER MEDICATION Take 1,000 mg by mouth 3 (three) times daily. Beet Root    [provider]  pantoprazole (PROTONIX) 40 MG tablet TAKE 1 TABLET DAILY AT 6 P.M. (MUST KEEP UPCOMING APPOINTMENT IN MAY 2023 WITH DR TURNER BEFORE ANYMORE REFILLS, FINAL ATTEMPT) 04/03/22   Sueanne Margarita, MD  rosuvastatin (CRESTOR) 5 MG tablet Take 1 tablet (5 mg total) by mouth daily. 12/29/21   Sueanne Margarita, MD      Allergies    Sulfamethoxazole-trimethoprim, Imdur [isosorbide nitrate], Amitriptyline, Keflex [cephalexin], and Topiramate    Review of Systems   Review of Systems  Constitutional:  Negative for chills and fever.  HENT:  Negative for ear pain and sore throat.  Eyes:  Negative for pain and visual disturbance.  Respiratory:  Negative for cough and shortness of breath.   Cardiovascular:  Positive for chest pain. Negative for palpitations.  Gastrointestinal:  Negative for abdominal pain and vomiting.  Genitourinary:  Negative for dysuria and hematuria.  Musculoskeletal:  Negative for arthralgias and back pain.  Skin:  Negative for color change and rash.  Neurological:  Negative for seizures and syncope.  All other systems reviewed and are negative.   Physical Exam Updated Vital Signs BP (!) 141/86 (BP Location: Right Arm)   Pulse 69   Temp 98  F (36.7 C) (Oral)   Resp 16   Ht '5\' 9"'$  (1.753 m)   Wt 86.2 kg   SpO2 98%   BMI 28.06 kg/m  Physical Exam Vitals and nursing note reviewed.  Constitutional:      General: He is not in acute distress.    Appearance: He is well-developed.  HENT:     Head: Normocephalic and atraumatic.  Eyes:     Conjunctiva/sclera: Conjunctivae normal.  Cardiovascular:     Rate and Rhythm: Normal rate and regular rhythm.     Pulses:          Radial pulses are 2+ on the right side and 2+ on the left side.     Heart sounds: No murmur heard. Pulmonary:     Effort: Pulmonary effort is normal. No respiratory distress.     Breath sounds: Normal breath sounds.  Abdominal:     Palpations: Abdomen is soft.     Tenderness: There is no abdominal tenderness.  Musculoskeletal:        General: No swelling.     Cervical back: Neck supple.     Right lower leg: No edema.     Left lower leg: No edema.  Skin:    General: Skin is warm and dry.     Capillary Refill: Capillary refill takes less than 2 seconds.  Neurological:     Mental Status: He is alert.  Psychiatric:        Mood and Affect: Mood normal.     ED Results / Procedures / Treatments   Labs (all labs ordered are listed, but only abnormal results are displayed) Labs Reviewed  TROPONIN I (HIGH SENSITIVITY)    EKG EKG Interpretation  Date/Time:  Tuesday April 24 2022 18:26:07 EDT Ventricular Rate:  71 PR Interval:  178 QRS Duration: 84 QT Interval:  368 QTC Calculation: 399 R Axis:   79 Text Interpretation: Normal sinus rhythm Normal ECG When compared with ECG of 24-Apr-2022 12:04, PREVIOUS ECG IS PRESENT Confirmed by Campbell Stall ([id number]) on 1/66/0630 7:24:27 PM  Radiology DG Chest 2 View  Result Date: 04/24/2022 CLINICAL DATA:  Chest pain and hypertension EXAM: CHEST - 2 VIEW COMPARISON:  None Available. FINDINGS: The heart size and mediastinal contours are within normal limits. Both lungs are clear. The visualized skeletal structures  are unremarkable. Irregular branching density projecting over the right aspect of the lower thoracic vertebral bodies may be related to residual contrast from myelogram 01/18/2022. Coronary stenting. IMPRESSION: No active cardiopulmonary disease. Electronically Signed   By: Placido Sou M.D.   On: 04/24/2022 13:07    Procedures Procedures    Medications Ordered in ED Medications - No data to display  ED Course/ Medical Decision Making/ A&P                           Medical  Decision Making  48:73 PM 66 year old male with past medical history of hypertension, hyperlipidemia, and cardiac stents presenting for complaints of chest pain.   The patient's chest pain is not suggestive of pulmonary embolus, cardiac ischemia, aortic dissection, pericarditis, myocarditis, pulmonary embolism, pneumothorax, pneumonia, Zoster, or esophageal perforation, or other serious etiology.  Historically not abrupt in onset, tearing or ripping, pulses symmetric. EKG nonspecific for ischemia/infarction. No dysrhythmias, brugada, WPW, prolonged QT noted. [CXR reviewed and WNL] [Troponin negative x2. CXR reviewed. Labs without demonstration of acute pathology unless otherwise noted above.    Low HEART Score: 5  Patient has resolution of chest pain at this time.  Possibly [place] to elevated blood pressures.  Patient states blood pressures are in the 160s at home.  Currently 141/86.  On amlodipine but did not take his dose today.  For amlodipine today however said he will take it as soon as he gets home.  Recommended for close follow-up with cardiology secondary to high heart score and recommendations for stress test.  Dilatory referral sent to patient's established cardiologist Dr. Radford Pax.  Patient in no distress and overall condition improved here in the ED. Detailed discussions were had with the patient regarding current findings, and need for close f/u with PCP or on call doctor. The patient has been instructed to  return immediately if the symptoms worsen in any way for re-evaluation. Patient verbalized understanding and is in agreement with current care plan. All questions answered prior to discharge.         Final Clinical Impression(s) / ED Diagnoses Final diagnoses:  Chest pain, unspecified type  Secondary hypertension    Rx / DC Orders ED Discharge Orders          Ordered    Ambulatory referral to Cardiology       Comments: Recurrent chest pain x 3 days, intermittent, resolved in ED. Heart score of 5. Stable ECG and trops.   04/24/22 2000              Lianne Cure, DO 49/35/52 2001

## 2022-04-24 NOTE — ED Triage Notes (Signed)
C/O chest pain 3 days ago that woke him up from sleep; along w/ shortness of breath and some nausea; reported was triaged at [facility] but did not see a provider.

## 2022-04-25 ENCOUNTER — Encounter: Payer: Self-pay | Admitting: Cardiology

## 2022-04-25 NOTE — Telephone Encounter (Signed)
Ronald Margarita, MD  Ronald Iba, RN Please get in with extender to evaluate chest pain          Called and spoke with patient and scheduled him for an office visit with Ambrose Pancoast, NP.

## 2022-04-30 NOTE — Progress Notes (Unsigned)
Office Visit    Patient Name: Ronald Tapia Date of Encounter: 05/02/2022  Primary Care Provider:  Osie Cheeks, PA-C Primary Cardiologist:  Fransico Him, MD Primary Electrophysiologist: None  Chief Complaint    Ronald Tapia is a 66 y.o. male with PMH of CAD s/p DES to LAD on 04/25/2020,GERD, whitecoat hypertension, HLD, family history of CAD, lymphoma, chronic headache with history of CSF leak with repair at [facility], presents today for follow-up of chest pain and elevated blood pressure.  Past Medical History    Past Medical History:  Diagnosis Date   Anxiety    Aortic atherosclerosis (HCC)    CAD (coronary artery disease)    a. 04/2020 Cor CTA: mid LAD dzs (FFR 0.86m 0.61d); b. 04/2020 PCI: LM nl, LAD 90p/m, [id number] Onyx DES), LCX nl, RCA nl.   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 04/2020 Echo: EF 65-70%, no rwma, triv MR/AI.   Hyperlipidemia 04/24/2020   [facility]  White coat syndrome without hypertension    Past Surgical History:  Procedure Laterality Date   BACK SURGERY     CORONARY STENT INTERVENTION N/A 04/25/2020   Procedure: CORONARY STENT INTERVENTION;  Surgeon: BerLorretta HarpD;  Location: [facility];  Service: Cardiovascular;  Laterality: N/A;   LEFT HEART CATH AND CORONARY ANGIOGRAPHY N/A 04/25/2020   Procedure: LEFT HEART CATH AND CORONARY ANGIOGRAPHY;  Surgeon: BerLorretta HarpD;  Location: [facility];  Service: Cardiovascular;  Laterality: N/A;    Allergies  Allergies  Allergen Reactions   Sulfamethoxazole-Trimethoprim Itching and Hives   Imdur [Isosorbide Nitrate] Other (See Comments)    Severe headache lasting about 12 hours   Amitriptyline Other (See Comments)    Made me feel strange   Keflex [Cephalexin] Other (See Comments)     Stomach pain   Topiramate Other (See Comments)    Made me feel strange    History of Present Illness    Ronald Tapia a 66 66ar old male with the above-mentioned past medical history presents today for follow-up of chest pain and elevated blood pressure.  He was initially seen 04/2020 with complaint of chest pain and left arm discomfort.  2D echo was completed and showed normal LV function without any significant valvular abnormalities.  Coronary CTA completed and revealed significant stenosis in the mid LAD with FFR of 0.72 in the mid vessel 0.61 in the distal vessel.  There was also aortic atherosclerosis noted.  LHC was completed on 04/25/2020 and revealed 90% severe mid to proximal LAD stenosis and 75% mid LAD stenosis.  PCI was performed with DES placed in LAD.  Patient placed on DAPT and was later discharged and readmitted on 7/23 with complaint of recurrent chest pain despite nitroglycerin.  Chest pain was palpable noted to the left lateral breast area.  EKG and troponins were both normal and patient was discharged with new prescription for Imdur.  Imdur was later stopped due to headaches and patient was started on amlodipine.  He was last seen in follow-up by Dr. TurRadford Pax 5/23 and was tolerating medications well with no new symptoms at that time.  [facility].  Patient described pain as left-sided intermittent and  nonradiating.  He states that symptoms were improved and then they later reoccurred.  His symptoms occur with or without exertion.  Blood pressures were elevated in the ED and at home per patient.  ACS work-up with normal troponins and EKG with no acute findings.   Ronald Tapia presents today with his wife for follow-up.  Since being seen in the ED patient reports that his chest pain has decreased to 2 out of 10 however he is still feeling discomfort in his left lateral chest wall.  I was able to reproduce his pain with palpitation and  he denies any shortness of breath or palpitations associated with this pain.  Ronald Tapia is compliant with his current medications and denies any related side effects at this time.  Patient denies chest pain, palpitations, dyspnea, PND, orthopnea, nausea, vomiting, dizziness, syncope, edema, weight gain, or early satiety.  Home Medications    Current Outpatient Medications  Medication Sig Dispense Refill   acetaminophen (TYLENOL) 650 MG CR tablet Take 1,300 mg by mouth 2 (two) times daily as needed for pain.     amLODipine (NORVASC) 2.5 MG tablet Take 1 tablet (2.5 mg total) by mouth daily. 90 tablet 3   ascorbic acid (VITAMIN C) 500 MG tablet Take 500 mg by mouth 2 (two) times daily.     aspirin EC 81 MG tablet Take 81 mg by mouth daily. Swallow whole.     Cholecalciferol (VITAMIN D3 PO) Take 7,500 Units by mouth every evening. 75000IU     clopidogrel (PLAVIX) 75 MG tablet Take 1 tablet (75 mg total) by mouth daily with breakfast. 90 tablet 3   Digestive Enzymes (DIGESTIVE ENZYME PO) Take 1 tablet by mouth 2 (two) times daily.     ezetimibe (ZETIA) 10 MG tablet Take 1 tablet (10 mg total) by mouth daily. 90 tablet 3   fluticasone (FLONASE) 50 MCG/ACT nasal spray Place 1 spray into both nostrils 2 (two) times daily as needed for rhinitis (congestion).     gabapentin (NEURONTIN) 300 MG capsule Take 600 mg by mouth 3 (three) times daily.     Melatonin 10 MG TABS Take 10 mg by mouth at bedtime.     methazolamide (NEPTAZANE) 25 MG tablet Take 25 mg by mouth 3 (three) times daily.     nitroGLYCERIN (NITROSTAT) 0.4 MG SL tablet Place 1 tablet (0.4 mg total) under the tongue every 5 (five) minutes x 3 doses as needed for chest pain. 25 tablet 3   OVER THE COUNTER MEDICATION Take 1,000 mg by mouth 3 (three) times daily. Beet Root     pantoprazole (PROTONIX) 40 MG tablet TAKE 1 TABLET DAILY AT 6 P.M. (MUST KEEP UPCOMING APPOINTMENT IN MAY 2023 WITH DR TURNER BEFORE ANYMORE REFILLS, FINAL ATTEMPT) 90  tablet 3   rosuvastatin (CRESTOR) 5 MG tablet Take 1 tablet (5 mg total) by mouth daily. 90 tablet 3   tiZANidine (ZANAFLEX) 4 MG capsule Take 4 mg by mouth daily as needed for muscle spasms.     No current facility-administered medications for this visit.     Review of Systems  Please see the history of present illness.    (+) Left lateral chest wall pain None completed today  All other systems reviewed and are otherwise negative except as noted above.  Physical Exam    Wt Readings from Last 3 Encounters:  05/02/22 193 lb 6.4 oz (87.7 kg)  04/24/22 190 lb (86.2 kg)  02/07/22 192 lb (87.1 kg)   VS:  Vitals:   05/02/22 0749  BP: 112/80  Pulse: 76  SpO2: 97%  ,Body mass index is 28.56 kg/m.  Constitutional:      Appearance: Healthy appearance. Not in distress.  Neck:     Vascular: JVD normal.  Pulmonary:     Effort: Pulmonary effort is normal.     Breath sounds: No wheezing. No rales. Diminished in the bases Cardiovascular:     Normal rate. Regular rhythm. Normal S1. Normal S2.      Murmurs: There is no murmur.  Edema:    Peripheral edema absent.  Abdominal:     Palpations: Abdomen is soft non tender. There is no hepatomegaly.  Skin:    General: Skin is warm and dry.  Neurological:     General: No focal deficit present.     Mental Status: Alert and oriented to person, place and time.     Cranial Nerves: Cranial nerves are intact.  EKG/LABS/Other Studies Reviewed    ECG personally reviewed by me today -none completed today   Lab Results  Component Value Date   WBC 5.7 04/24/2022   HGB 15.3 04/24/2022   HCT 44.5 04/24/2022   MCV 94.3 04/24/2022   PLT 164 04/24/2022   Lab Results  Component Value Date   CREATININE 0.90 04/24/2022   BUN 12 04/24/2022   NA 140 04/24/2022   K 4.0 04/24/2022   CL 108 04/24/2022   CO2 23 04/24/2022   Lab Results  Component Value Date   ALT 38 01/12/2022   AST 30 01/12/2022   ALKPHOS 49 01/12/2022   BILITOT 0.4  01/12/2022   Lab Results  Component Value Date   CHOL 119 01/12/2022   HDL 46 01/12/2022   LDLCALC 57 01/12/2022   TRIG 77 01/12/2022   CHOLHDL 2.6 01/12/2022    No results found for: "HGBA1C"  Assessment & Plan    1.  Coronary artery disease: -CAD s/p DES to LAD on 04/25/2020 -2D echo completed 04/2020 with EF of 65-70% and normal valve structures with no LVH -Today patient reports that his pain has reduced to 2 out of 10 however it is still bothersome and is similar to his pain that occurred and 2021 prior to his intervention. -Continue GDMT with ASA 81 mg, Plavix 75 mg, Zetia 10 mg and Crestor 5 mg -As needed nitroglycerin 0.4 mg  2.  Chest pain: -Patient was seen in the ED for complaint of chest pain with negative enzymes and EKG within normal limits. -We will send for GXT stress test to rule out possible ischemic cause of chest pain  3.  HTN: -Blood pressure today was 112/80 well-controlled.  Patient also presented with copies of blood pressures at home that were also well controlled in the 110-130's -Continue amlodipine 2.5 mg   4. HLD: -Patient's last LDL cholesterol was 57 below goal of less than 70 on 01/2022 -Continue Crestor and Zetia as noted above.  -We discussed the possibility of chest pain being related to myalgias with his Crestor.  We will discuss further if results of nuclear stress test are negative.   Disposition: Follow-up with Fransico Him, MD or APP in 1 month   Shared Decision Making/Informed Consent The risks [chest pain, shortness of breath, cardiac arrhythmias, dizziness, blood pressure fluctuations, myocardial infarction, stroke/transient ischemic attack, nausea, vomiting, allergic reaction, radiation exposure, metallic taste sensation and life-threatening complications (estimated to be 1 in 10,000)], benefits (risk stratification, diagnosing coronary artery disease, treatment guidance) and alternatives of a nuclear  stress test were discussed in detail  with Mr. Brann and he agrees to proceed.   Medication Adjustments/Labs and Tests Ordered: Current medicines are reviewed at length with the patient today.  Concerns regarding medicines are outlined above.   Signed, Mable Fill, Marissa Nestle, NP 05/02/2022, 8:42 AM [place]

## 2022-05-02 ENCOUNTER — Ambulatory Visit (INDEPENDENT_AMBULATORY_CARE_PROVIDER_SITE_OTHER): Payer: Managed Care, Other (non HMO) | Admitting: Nurse Practitioner

## 2022-05-02 ENCOUNTER — Encounter: Payer: Self-pay | Admitting: Nurse Practitioner

## 2022-05-02 VITALS — BP 112/80 | HR 76 | Ht 69.0 in | Wt 193.4 lb

## 2022-05-02 DIAGNOSIS — I25118 Atherosclerotic heart disease of native coronary artery with other forms of angina pectoris: Secondary | ICD-10-CM | POA: Diagnosis not present

## 2022-05-02 DIAGNOSIS — R079 Chest pain, unspecified: Secondary | ICD-10-CM | POA: Diagnosis not present

## 2022-05-02 DIAGNOSIS — E7800 Pure hypercholesterolemia, unspecified: Secondary | ICD-10-CM

## 2022-05-02 DIAGNOSIS — I10 Essential (primary) hypertension: Secondary | ICD-10-CM

## 2022-05-02 NOTE — Patient Instructions (Addendum)
Medication Instructions:  Your physician has recommended you make the following change in your medication:    *If you need a refill on your cardiac medications before your next appointment, please call your pharmacy*   Lab Work: None ordered  If you have labs (blood work) drawn today and your tests are completely normal, you will receive your results only by: [place] (if you have MyChart) OR A paper copy in the mail If you have any lab test that is abnormal or we need to change your treatment, we will call you to review the results.   Testing/Procedures: Your physician has requested that you have an exercise tolerance test. For further information please visit [email or web address]. Please also follow instruction sheet, BELOW:  - DO NOT EAT, DRINK, OR USE TOBACCO PRODUCTS WITHIN 4 HOURS OF THE TEST - DRESS PREPARED TO EXERCISE IN A COMFORTABLE, 2 PIECE CLOTHING OUTFIT AND WALKING SHOES - BRING ANY PRESCRIPTION MEDICATIONS WITH YOU  - NOTIFY THE OFFICE 24 HOURS IN ADVANCE IF YOU NEED TO CANCEL - CALL THE OFFICE [phone] IF YOU HAVE ANY QUESTIONS     Follow-Up: At [facility], you and your health needs are our priority.  As part of our continuing mission to provide you with exceptional heart care, we have created designated Provider Care Teams.  These Care Teams include your primary Cardiologist (physician) and Advanced Practice Providers (APPs -  Physician Assistants and Nurse Practitioners) who all work together to provide you with the care you need, when you need it.  We recommend signing up for the patient portal called "MyChart".  Sign up information is provided on this After Visit Summary.  MyChart is used to connect with patients for Virtual Visits (Telemedicine).  Patients are able to view lab/test results, encounter notes, upcoming appointments, etc.  Non-urgent messages can be sent to your provider as well.   To learn more about what you can do with MyChart, go to  [email or web address].    Your next appointment:   1 month(s)  The format for your next appointment:   In Person  Provider:   Fransico Him, MD     Other Instructions   Important Information About Sugar

## 2022-05-08 ENCOUNTER — Ambulatory Visit (INDEPENDENT_AMBULATORY_CARE_PROVIDER_SITE_OTHER): Payer: Managed Care, Other (non HMO)

## 2022-05-08 DIAGNOSIS — R079 Chest pain, unspecified: Secondary | ICD-10-CM | POA: Diagnosis not present

## 2022-05-16 LAB — EXERCISE TOLERANCE TEST
Angina Index: 2
Duke Treadmill Score: -1
Estimated workload: 8.5
Exercise duration (min): 7 min
Exercise duration (sec): 2 s
MPHR: 155 {beats}/min
Peak HR: 144 {beats}/min
Percent HR: 92 %
RPE: 17
Rest HR: 66 {beats}/min
ST Depression (mm): 0 mm

## 2022-05-18 ENCOUNTER — Telehealth: Payer: Self-pay | Admitting: Nurse Practitioner

## 2022-05-18 NOTE — Telephone Encounter (Signed)
Ronald Tapia., NP  05/17/2022  7:28 AM EDT     The results of your stress test were normal and are reassuring that your chest pain is not caused by any ischemia or arrhythmias in your heart.  Please follow-up with your PCP regarding possible musculoskeletal cause for your chest pain.  Please let me know if you have any additional questions.   The patient has been notified of the result and verbalized understanding.  All questions (if any) were answered. Antonieta Iba, RN 05/18/2022 12:05 PM

## 2022-05-18 NOTE — Telephone Encounter (Signed)
Pt returning a call for exercise test results

## 2022-05-21 ENCOUNTER — Encounter: Payer: Self-pay | Admitting: Cardiology

## 2022-06-01 ENCOUNTER — Ambulatory Visit: Payer: Managed Care, Other (non HMO) | Admitting: Nurse Practitioner

## 2022-06-05 ENCOUNTER — Ambulatory Visit: Payer: Managed Care, Other (non HMO) | Admitting: Nurse Practitioner

## 2023-01-07 ENCOUNTER — Encounter (HOSPITAL_BASED_OUTPATIENT_CLINIC_OR_DEPARTMENT_OTHER): Payer: Self-pay | Admitting: Cardiology

## 2023-01-08 ENCOUNTER — Other Ambulatory Visit: Payer: Self-pay

## 2023-01-08 MED ORDER — AMLODIPINE BESYLATE 2.5 MG PO TABS: 2.5000 mg | ORAL_TABLET | Freq: Every day | ORAL | 0 refills | Status: DC

## 2023-01-08 MED ORDER — ROSUVASTATIN CALCIUM 5 MG PO TABS: 5.0000 mg | ORAL_TABLET | Freq: Every day | ORAL | 0 refills | Status: DC

## 2023-01-08 MED ORDER — CLOPIDOGREL BISULFATE 75 MG PO TABS: 75.0000 mg | ORAL_TABLET | Freq: Every day | ORAL | 0 refills | Status: DC

## 2023-01-08 MED ORDER — EZETIMIBE 10 MG PO TABS: 10.0000 mg | ORAL_TABLET | Freq: Every day | ORAL | 0 refills | Status: DC

## 2023-03-29 ENCOUNTER — Other Ambulatory Visit: Payer: Self-pay | Admitting: Cardiology

## 2023-05-09 ENCOUNTER — Other Ambulatory Visit (HOSPITAL_BASED_OUTPATIENT_CLINIC_OR_DEPARTMENT_OTHER): Payer: Self-pay | Admitting: Cardiology

## 2023-05-20 ENCOUNTER — Other Ambulatory Visit (HOSPITAL_BASED_OUTPATIENT_CLINIC_OR_DEPARTMENT_OTHER): Payer: Self-pay | Admitting: Cardiology

## 2023-05-21 MED ORDER — EZETIMIBE 10 MG PO TABS: 10.0000 mg | ORAL_TABLET | Freq: Every day | ORAL | 0 refills | Status: DC

## 2023-05-21 MED ORDER — ROSUVASTATIN CALCIUM 5 MG PO TABS: 5.0000 mg | ORAL_TABLET | Freq: Every day | ORAL | 0 refills | Status: DC

## 2023-06-04 ENCOUNTER — Other Ambulatory Visit: Payer: Self-pay | Admitting: *Deleted

## 2023-06-04 ENCOUNTER — Encounter (HOSPITAL_BASED_OUTPATIENT_CLINIC_OR_DEPARTMENT_OTHER): Payer: Self-pay | Admitting: Cardiology

## 2023-06-04 DIAGNOSIS — E7800 Pure hypercholesterolemia, unspecified: Secondary | ICD-10-CM

## 2023-06-04 NOTE — Telephone Encounter (Signed)
Please advise patient that we can check his cholesterol and please order LFTs and lipids to his designated facility of choice.  Please advise patient that results will be made available and recommendations will discuss further at his follow-up in September.  Please let me know if you have any further questions.  Robin Searing, NP

## 2023-06-04 NOTE — Telephone Encounter (Signed)
Quintella Reichert, MD  You; Luellen Pucker, RN; Cv Div Ch St Triage6 minutes ago (12:33 PM)    I am a sleep specialist as well as Cardiologist so lets start by getting an [place] home sleep study

## 2023-06-11 ENCOUNTER — Telehealth: Payer: Self-pay | Admitting: *Deleted

## 2023-06-11 NOTE — Telephone Encounter (Signed)
Patient agreement signed on 06/11/2023. WatchPAT issued to patient on 06/11/2023 by Danielle Rankin, CMA. Patient profile initialized in CloudPAT on 06/11/2023 by Danielle Rankin, CMA. Device serial number: [id number] PT AWARE TO NOT OPEN BOX UNTIL CALLED WITH PIN# PT AGREEABLE TO SIGNED WAIVER  Please list Reason for Call as Advice Only and type "WatchPAT issued to patient" in the comment box.

## 2023-06-11 NOTE — Telephone Encounter (Signed)
Patient Name:         DOB:       Height:     Weight:  Office Name:         Referring Provider:  Today's Date:  Date:   STOP BANG RISK ASSESSMENT S (snore) Have you been told that you snore?     YES   T (tired) Are you often tired, fatigued, or sleepy during the day?   YES  O (obstruction) Do you stop breathing, choke, or gasp during sleep? YES   P (pressure) Do you have or are you being treated for high blood pressure? YES   B (BMI) Is your body index greater than 35 kg/m? NO   A (age) Are you 66 years old or older? YES   N (neck) Do you have a neck circumference greater than 16 inches?   NO   G (gender) Are you a male? YES   TOTAL STOP/BANG "YES" ANSWERS 6                                                                        For Office Use Only              Procedure Order Form    YES to 3+ Stop Bang questions OR two clinical symptoms - patient qualifies for WatchPAT (CPT 95800)             Clinical Notes: Will consult Sleep Specialist and refer for management of therapy due to patient increased risk of Sleep Apnea. Ordering a sleep study due to the following two clinical symptoms: Excessive daytime sleepiness G47.10 / Gastroesophageal reflux K21.9 / Nocturia R35.1 / Morning Headaches G44.221 / Difficulty concentrating R41.840 / Memory problems or poor judgment G31.84 / Personality changes or irritability R45.4 / Loud snoring R06.83 / Depression F32.9 / Unrefreshed by sleep G47.8 / Impotence N52.9 / History of high blood pressure R03.0 / Insomnia G47.00    I understand that I am proceeding with a home sleep apnea test as ordered by my treating physician. I understand that untreated sleep apnea is a serious cardiovascular risk factor and it is my responsibility to perform the test and seek management for sleep apnea. I will be contacted with the results and be managed for sleep apnea by a local sleep physician. I will be receiving equipment and further instructions from  [facility]. I shall promptly ship back the equipment via the included mailing label. I understand my insurance will be billed for the test and as the patient I am responsible for any insurance related out-of-pocket costs incurred. I have been provided with written instructions and can call for additional video or telephonic instruction, with 24-hour availability of qualified personnel to answer any questions: Patient Help Desk [phone].  Patient Signature ______________________________________________________   Date______________________ Patient Telemedicine Verbal Consent

## 2023-06-15 LAB — LIPID PANEL
Chol/HDL Ratio: 3.2 ratio (ref 0.0–5.0)
Cholesterol, Total: 123 mg/dL (ref 100–199)
HDL: 39 mg/dL — ABNORMAL LOW (ref >39)
LDL Chol Calc (NIH): 72 mg/dL (ref 0–99)
Triglycerides: 51 mg/dL (ref 0–149)
VLDL Cholesterol Cal: 12 mg/dL (ref 5–40)

## 2023-06-15 LAB — HEPATIC FUNCTION PANEL
ALT: 20 IU/L (ref 0–44)
AST: 17 IU/L (ref 0–40)
Albumin: 4.1 g/dL (ref 3.9–4.9)
Alkaline Phosphatase: 50 IU/L (ref 44–121)
Bilirubin Total: 0.3 mg/dL (ref 0.0–1.2)
Bilirubin, Direct: 0.1 mg/dL (ref 0.00–0.40)
Total Protein: 6.9 g/dL (ref 6.0–8.5)

## 2023-06-24 NOTE — Progress Notes (Unsigned)
Cardiology Office Note    Patient Name: Ronald Tapia Date of Encounter: 06/24/2023  Primary Care Provider:  Isabella Bowens, PA-C Primary Cardiologist:  Armanda Magic, MD Primary Electrophysiologist: None   Past Medical History    Past Medical History:  Diagnosis Date   Anxiety    Aortic atherosclerosis (HCC)    CAD (coronary artery disease)    a. 04/2020 Cor CTA: mid LAD dzs (FFR 0.32m, 0.61d); b. 04/2020 PCI: LM nl, LAD 90p/m, 55m (2.75x60mm Resolute Onyx DES), LCX nl, RCA nl.   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 04/2020 Echo: EF 65-70%, no rwma, triv MR/AI.   Hyperlipidemia 04/24/2020   Lymphoma (HCC)    White coat syndrome without hypertension     History of Present Illness   Ronald Tapia is a 67 y.o. male with PMH of CAD s/p DES to LAD on 04/25/2020,GERD, whitecoat hypertension, HLD, family history of CAD, lymphoma, chronic headache with history of CSF leak with repair at [facility], presents today for follow-up   Mr. Farha was last seen on 05/02/2022 for follow-up visit.  During visit patient had recently presented to the ED with chest pain with ACS workup that was normal.  He was sent for a GXT that was normal and pain was deemed possibly musculoskeletal.  Cholesterol levels were also completed recently showing patient's liver functions and cholesterol numbers were stable.  Blood pressure was well-controlled during visit and he was compliant with his current medications.  He recently completed a home sleep study test that he is currently pending.  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  He was recently provided a home sleep study but has not been completed due to preauthorization and insurance approval.  He reports that his insomnia  developed shortly after having his CSF leak repaired in early 2023.  He is currently on temazepam for help with insomnia.  He also reports developing neuropathic pain in his lower extremities and arms and fingertips.  He is concerned that his statin therapy may be contributing to his discomfort.  His blood pressure today is controlled at 138/88 and heart rate is 76 bpm.  Patient's EKG today showed new PACs which are asymptomatic.  He continues to have chest discomfort which is primarily musculoskeletal in nature and related to muscle use.  Patient denies chest pain, palpitations, dyspnea, PND, orthopnea, nausea, vomiting, dizziness, syncope, edema, weight gain, or early satiety.  Review of Systems  Please see the history of present illness.    All other systems reviewed and are otherwise negative except as noted above.  Physical Exam    Wt Readings from Last 3 Encounters:  05/02/22 193 lb 6.4 oz (87.7 kg)  04/24/22 190 lb (86.2 kg)  02/07/22 192 lb (87.1 kg)   ZO:XWRUE were no vitals filed for this visit.,There is no height or weight on file to calculate BMI. GEN: Well nourished, well developed in no acute distress Neck: No JVD; No carotid bruits Pulmonary: Clear to auscultation without rales, wheezing or rhonchi  Cardiovascular: Normal rate. Regular rhythm. Normal S1. Normal S2.   Murmurs: There is no murmur.  ABDOMEN: Soft, non-tender, non-distended EXTREMITIES:  No edema; No deformity   EKG/LABS/ Recent Cardiac Studies  ECG personally reviewed by me today -sinus rhythm with PACs rate of 76 bpm with no acute changes consistent with previous EKG.  Risk Assessment/Calculations:          Lab Results  Component Value Date   WBC 5.7 04/24/2022   HGB 15.3 04/24/2022   HCT 44.5 04/24/2022   MCV 94.3 04/24/2022   PLT 164 04/24/2022   Lab Results  Component Value Date   CREATININE 0.90 04/24/2022   BUN 12 04/24/2022   NA 140 04/24/2022   K 4.0 04/24/2022   CL 108 04/24/2022    CO2 23 04/24/2022   Lab Results  Component Value Date   CHOL 123 06/14/2023   HDL 39 (L) 06/14/2023   LDLCALC 72 06/14/2023   TRIG 51 06/14/2023   CHOLHDL 3.2 06/14/2023    No results found for: "HGBA1C" Assessment & Plan    Coronary artery disease: -CAD s/p DES to LAD on 04/25/2020 -2D echo completed 04/2020 with EF of 65-70% and normal valve structures with no LVH -Today patient reports chest wall pain that is primarily musculoskeletal in nature was advised to try over-the-counter analgesics or topical creams such as Tylenol arthritis or Salonpas. -Continue current GDMT with Plavix 75 mg, ASA 81 mg and Nitrostat 0.4 mg  2.  HTN: -Blood pressure today was controlled at 138/88 -Continue amlodipine 2.5 mg daily  3. HLD: -Patient's last LDL cholesterol was 72 slightly goal of less than 70 -Patient reports possible neuropathy and we will have him hold Crestor 5 mg daily and Zetia 10 mg daily for 30 days. -Patient was advised that if discomfort resolves we will refer him to the lipid clinic however if unchanged he will resume current regimen.  4.  History of OSA: -Patient provided home sleep study that is currently awaiting approval by insurance and has not been completed. -Patient will start sleep study once approval is initiated.  5.  PACs: -Patient's EKG today showed new PACs  -We will update his 2D echo to rule out possible structural heart changes -Patient will also wear 7-day ZIO to evaluate for arrhythmia     Disposition: Follow-up with Armanda Magic, MD or APP in 3 months    Signed, Napoleon Form, Leodis Rains, NP 06/24/2023, 8:10 AM  [facility]

## 2023-06-26 ENCOUNTER — Ambulatory Visit: Payer: Managed Care, Other (non HMO) | Attending: Nurse Practitioner

## 2023-06-26 ENCOUNTER — Encounter: Payer: Self-pay | Admitting: Nurse Practitioner

## 2023-06-26 ENCOUNTER — Telehealth: Payer: Self-pay | Admitting: Cardiology

## 2023-06-26 ENCOUNTER — Other Ambulatory Visit: Payer: Self-pay | Admitting: Nurse Practitioner

## 2023-06-26 ENCOUNTER — Ambulatory Visit: Payer: Managed Care, Other (non HMO) | Attending: Nurse Practitioner | Admitting: Nurse Practitioner

## 2023-06-26 VITALS — BP 138/88 | HR 76 | Ht 69.0 in | Wt 199.2 lb

## 2023-06-26 DIAGNOSIS — I25118 Atherosclerotic heart disease of native coronary artery with other forms of angina pectoris: Secondary | ICD-10-CM

## 2023-06-26 DIAGNOSIS — I491 Atrial premature depolarization: Secondary | ICD-10-CM

## 2023-06-26 DIAGNOSIS — G4733 Obstructive sleep apnea (adult) (pediatric): Secondary | ICD-10-CM

## 2023-06-26 DIAGNOSIS — I10 Essential (primary) hypertension: Secondary | ICD-10-CM

## 2023-06-26 DIAGNOSIS — E7800 Pure hypercholesterolemia, unspecified: Secondary | ICD-10-CM

## 2023-06-26 NOTE — Patient Instructions (Signed)
Medication Instructions:  Your physician has recommended you make the following change in your medication:  PLEASE HOLD ZETIA(EZETIMIBE) AND CRESTOR(ROSUVASTATIN) FOR 30(THIRTY) DAYS. PLEASE CALL OUR OFFICE TO LET us KNOW HOW YOU FEEL BEFORE RESUMING.   *If you need a refill on your cardiac medications before your next appointment, please call your pharmacy*   Lab Work: None  If you have labs (blood work) drawn today and your tests are completely normal, you will receive your results only by: MyChart Message (if you have MyChart) OR A paper copy in the mail If you have any lab test that is abnormal or we need to change your treatment, we will call you to review the results.   Testing/Procedures: Your physician has requested that you have an echocardiogram. Echocardiography is a painless test that uses sound waves to create images of your heart. It provides your doctor with information about the size and shape of your heart and how well your heart's chambers and valves are working. This procedure takes approximately one hour. There are no restrictions for this procedure. Please do NOT wear cologne, aftershave, or lotions (deodorant is allowed). Please arrive 15 minutes prior to your appointment time.   ZIO XT- Long Term Monitor Instructions  Your physician has requested you wear a ZIO patch monitor for 7 days.  This is a single patch monitor. [employer] supplies one patch monitor per enrollment. Additional stickers are not available. Please do not apply patch if you will be having a Nuclear Stress Test,  Echocardiogram, Cardiac CT, MRI, or Chest Xray during the period you would be wearing the  monitor. The patch cannot be worn during these tests. You cannot remove and re-apply the  ZIO XT patch monitor.  Your ZIO patch monitor will be mailed 3 day USPS to your address on file. It may take 3-5 days  to receive your monitor after you have been enrolled.  Once you have received your monitor,  please review the enclosed instructions. Your monitor  has already been registered assigning a specific monitor serial # to you.  Billing and Patient Assistance Program Information  We have supplied [employer] with any of your insurance information on file for billing purposes. [employer] offers a sliding scale Patient Assistance Program for patients that do not have  insurance, or whose insurance does not completely cover the cost of the ZIO monitor.  You must apply for the Patient Assistance Program to qualify for this discounted rate.  To apply, please call [employer] at [phone], select option 4, select option 2, ask to apply for  Patient Assistance Program. Meredeth Ide will ask your household income, and how many people  are in your household. They will quote your out-of-pocket cost based on that information.  [employer] will also be able to set up a 6-month, interest-free payment plan if needed.  Applying the monitor   Shave hair from upper left chest.  Hold abrader disc by orange tab. Rub abrader in 40 strokes over the upper left chest as  indicated in your monitor instructions.  Clean area with 4 enclosed alcohol pads. Let dry.  Apply patch as indicated in monitor instructions. Patch will be placed under collarbone on left  side of chest with arrow pointing upward.  Rub patch adhesive wings for 2 minutes. Remove white label marked "1". Remove the white  label marked "2". Rub patch adhesive wings for 2 additional minutes.  While looking in a mirror, press and release button in center of patch. A small green  light will  flash 3-4 times. This will be your only indicator that the monitor has been turned on.  Do not shower for the first 24 hours. You may shower after the first 24 hours.  Press the button if you feel a symptom. You will hear a small click. Record Date, Time and  Symptom in the Patient Logbook.  When you are ready to remove the patch, follow instructions on the last 2 pages of  Patient  Logbook. Stick patch monitor onto the last page of Patient Logbook.  Place Patient Logbook in the blue and white box. Use locking tab on box and tape box closed  securely. The blue and white box has prepaid postage on it. Please place it in the mailbox as  soon as possible. Your physician should have your test results approximately 7 days after the  monitor has been mailed back to [facility].  Call [facility] Customer Care at [phone] if you have questions regarding  your ZIO XT patch monitor. Call them immediately if you see an orange light blinking on your  monitor.  If your monitor falls off in less than 4 days, contact our Monitor department at [phone].  If your monitor becomes loose or falls off after 4 days call [employer] at [phone] for  suggestions on securing your monitor    Follow-Up: At [facility], you and your health needs are our priority.  As part of our continuing mission to provide you with exceptional heart care, we have created designated Provider Care Teams.  These Care Teams include your primary Cardiologist (physician) and Advanced Practice Providers (APPs -  Physician Assistants and Nurse Practitioners) who all work together to provide you with the care you need, when you need it.  We recommend signing up for the patient portal called "MyChart".  Sign up information is provided on this After Visit Summary.  MyChart is used to connect with patients for Virtual Visits (Telemedicine).  Patients are able to view lab/test results, encounter notes, upcoming appointments, etc.  Non-urgent messages can be sent to your provider as well.   To learn more about what you can do with MyChart, go to [email or web address].    Your next appointment:   3-4 month(s)  Provider:   Armanda Magic, MD     Other Instructions Heart-Healthy Eating Plan Many factors influence your heart health, including eating and exercise habits. Heart health is also  called coronary health. Coronary risk increases with abnormal blood fat (lipid) levels. A heart-healthy eating plan includes limiting unhealthy fats, increasing healthy fats, limiting salt (sodium) intake, and making other diet and lifestyle changes. What is my plan? Your health care provider may recommend that: You limit your fat intake to _________% or less of your total calories each day. You limit your saturated fat intake to _________% or less of your total calories each day. You limit the amount of cholesterol in your diet to less than _________ mg per day. You limit the amount of sodium in your diet to less than _________ mg per day. What are tips for following this plan? Cooking Cook foods using methods other than frying. Baking, boiling, grilling, and broiling are all good options. Other ways to reduce fat include: Removing the skin from poultry. Removing all visible fats from meats. Steaming vegetables in water or broth. Meal planning  At meals, imagine dividing your plate into fourths: Fill one-half of your plate with vegetables and green salads. Fill one-fourth of your plate with whole grains.  Fill one-fourth of your plate with lean protein foods. Eat 2-4 cups of vegetables per day. One cup of vegetables equals 1 cup (91 g) broccoli or cauliflower florets, 2 medium carrots, 1 large bell pepper, 1 large sweet potato, 1 large tomato, 1 medium white potato, 2 cups (150 g) raw leafy greens. Eat 1-2 cups of fruit per day. One cup of fruit equals 1 small apple, 1 large banana, 1 cup (237 g) mixed fruit, 1 large orange,  cup (82 g) dried fruit, 1 cup (240 mL) 100% fruit juice. Eat more foods that contain soluble fiber. Examples include apples, broccoli, carrots, beans, peas, and barley. Aim to get 25-30 g of fiber per day. Increase your consumption of legumes, nuts, and seeds to 4-5 servings per week. One serving of dried beans or legumes equals  cup (90 g) cooked, 1 serving of nuts  is  oz (12 almonds, 24 pistachios, or 7 walnut halves), and 1 serving of seeds equals  oz (8 g). Fats Choose healthy fats more often. Choose monounsaturated and polyunsaturated fats, such as olive and canola oils, avocado oil, flaxseeds, walnuts, almonds, and seeds. Eat more omega-3 fats. Choose salmon, mackerel, sardines, tuna, flaxseed oil, and ground flaxseeds. Aim to eat fish at least 2 times each week. Check food labels carefully to identify foods with trans fats or high amounts of saturated fat. Limit saturated fats. These are found in animal products, such as meats, butter, and cream. Plant sources of saturated fats include palm oil, palm kernel oil, and coconut oil. Avoid foods with partially hydrogenated oils in them. These contain trans fats. Examples are stick margarine, some tub margarines, cookies, crackers, and other baked goods. Avoid fried foods. General information Eat more home-cooked food and less restaurant, buffet, and fast food. Limit or avoid alcohol. Limit foods that are high in added sugar and simple starches such as foods made using white refined flour (white breads, pastries, sweets). Lose weight if you are overweight. Losing just 5-10% of your body weight can help your overall health and prevent diseases such as diabetes and heart disease. Monitor your sodium intake, especially if you have high blood pressure. Talk with your health care provider about your sodium intake. Try to incorporate more vegetarian meals weekly. What foods should I eat? Fruits All fresh, canned (in natural juice), or frozen fruits. Vegetables Fresh or frozen vegetables (raw, steamed, roasted, or grilled). Green salads. Grains Most grains. Choose whole wheat and whole grains most of the time. Rice and pasta, including brown rice and pastas made with whole wheat. Meats and other proteins Lean, well-trimmed beef, veal, pork, and lamb. Chicken and [place] without skin. All fish and shellfish.  Wild duck, rabbit, pheasant, and venison. Egg whites or low-cholesterol egg substitutes. Dried beans, peas, lentils, and tofu. Seeds and most nuts. Dairy Low-fat or nonfat cheeses, including ricotta and mozzarella. Skim or 1% milk (liquid, powdered, or evaporated). Buttermilk made with low-fat milk. Nonfat or low-fat yogurt. Fats and oils Non-hydrogenated (trans-free) margarines. Vegetable oils, including soybean, sesame, sunflower, olive, avocado, peanut, safflower, corn, canola, and cottonseed. Salad dressings or mayonnaise made with a vegetable oil. Beverages Water (mineral or sparkling). Coffee and tea. Unsweetened ice tea. Diet beverages. Sweets and desserts Sherbet, gelatin, and fruit ice. Small amounts of dark chocolate. Limit all sweets and desserts. Seasonings and condiments All seasonings and condiments. The items listed above may not be a complete list of foods and beverages you can eat. Contact a dietitian for more options. What foods  should I avoid? Fruits Canned fruit in heavy syrup. Fruit in cream or butter sauce. Fried fruit. Limit coconut. Vegetables Vegetables cooked in cheese, cream, or butter sauce. Fried vegetables. Grains Breads made with saturated or trans fats, oils, or whole milk. Croissants. Sweet rolls. Donuts. High-fat crackers, such as cheese crackers and chips. Meats and other proteins Fatty meats, such as hot dogs, ribs, sausage, bacon, rib-eye roast or steak. High-fat deli meats, such as salami and bologna. Caviar. Domestic duck and goose. Organ meats, such as liver. Dairy Cream, sour cream, cream cheese, and creamed cottage cheese. Whole-milk cheeses. Whole or 2% milk (liquid, evaporated, or condensed). Whole buttermilk. Cream sauce or high-fat cheese sauce. Whole-milk yogurt. Fats and oils Meat fat, or shortening. Cocoa butter, hydrogenated oils, palm oil, coconut oil, palm kernel oil. Solid fats and shortenings, including bacon fat, salt pork, lard, and  butter. Nondairy cream substitutes. Salad dressings with cheese or sour cream. Beverages Regular sodas and any drinks with added sugar. Sweets and desserts Frosting. Pudding. Cookies. Cakes. Pies. Milk chocolate or white chocolate. Buttered syrups. Full-fat ice cream or ice cream drinks. The items listed above may not be a complete list of foods and beverages to avoid. Contact a dietitian for more information. Summary Heart-healthy meal planning includes limiting unhealthy fats, increasing healthy fats, limiting salt (sodium) intake and making other diet and lifestyle changes. Lose weight if you are overweight. Losing just 5-10% of your body weight can help your overall health and prevent diseases such as diabetes and heart disease. Focus on eating a balance of foods, including fruits and vegetables, low-fat or nonfat dairy, lean protein, nuts and legumes, whole grains, and heart-healthy oils and fats. This information is not intended to replace advice given to you by your health care provider. Make sure you discuss any questions you have with your health care provider. Document Revised: 10/30/2021 Document Reviewed: 10/30/2021 Elsevier Patient Education  2024 [employer].

## 2023-06-26 NOTE — Telephone Encounter (Signed)
Prior Authorization for [facility] sent to [employer] via web portal. Tracking Number . [employer]-NO PA REQ   Ordering provider: Robin Searing Associated diagnoses: G47.33-OSA Authorization: NO PA REQ Patient notified of PIN (1234) on 06/26/2023 via Notification Method: phone.

## 2023-06-26 NOTE — Progress Notes (Unsigned)
Enrolled for [employer] to mail a ZIO XT long term holter monitor to the patients address on file.   Dr. Mayford Knife to read.

## 2023-06-26 NOTE — Telephone Encounter (Signed)
Pt would like contact about a sleep study that was ordered on sept 6th. Please call back and advise when available.

## 2023-06-27 ENCOUNTER — Other Ambulatory Visit: Payer: Self-pay | Admitting: Cardiology

## 2023-06-27 NOTE — Telephone Encounter (Signed)
Prior Authorization for [facility] sent to [employer] via web portal. Tracking Number . [employer]-NO PA REQ

## 2023-06-29 DIAGNOSIS — I25118 Atherosclerotic heart disease of native coronary artery with other forms of angina pectoris: Secondary | ICD-10-CM | POA: Diagnosis not present

## 2023-06-29 DIAGNOSIS — I491 Atrial premature depolarization: Secondary | ICD-10-CM

## 2023-06-29 DIAGNOSIS — E7800 Pure hypercholesterolemia, unspecified: Secondary | ICD-10-CM | POA: Diagnosis not present

## 2023-06-29 DIAGNOSIS — G4733 Obstructive sleep apnea (adult) (pediatric): Secondary | ICD-10-CM

## 2023-06-29 DIAGNOSIS — I10 Essential (primary) hypertension: Secondary | ICD-10-CM | POA: Diagnosis not present

## 2023-07-03 NOTE — Telephone Encounter (Signed)
See 06/11/23 phone notes for further information.

## 2023-07-03 NOTE — Telephone Encounter (Signed)
Left message for the pt ok to proceed with [facility] sleep study. Left message with the PIN#. Left message to call back or send message thru MY CHART to confirm message received.

## 2023-07-14 ENCOUNTER — Encounter (HOSPITAL_BASED_OUTPATIENT_CLINIC_OR_DEPARTMENT_OTHER): Payer: Self-pay

## 2023-07-14 ENCOUNTER — Other Ambulatory Visit: Payer: Self-pay

## 2023-07-14 ENCOUNTER — Emergency Department (HOSPITAL_BASED_OUTPATIENT_CLINIC_OR_DEPARTMENT_OTHER)
Admission: EM | Admit: 2023-07-14 | Discharge: 2023-07-14 | Disposition: A | Discharge status: Home / Self Care | Payer: Managed Care, Other (non HMO) | Attending: Emergency Medicine | Admitting: Emergency Medicine

## 2023-07-14 DIAGNOSIS — I2510 Atherosclerotic heart disease of native coronary artery without angina pectoris: Secondary | ICD-10-CM | POA: Diagnosis not present

## 2023-07-14 DIAGNOSIS — U071 COVID-19: Secondary | ICD-10-CM | POA: Diagnosis present

## 2023-07-14 DIAGNOSIS — Z859 Personal history of malignant neoplasm, unspecified: Secondary | ICD-10-CM | POA: Insufficient documentation

## 2023-07-14 DIAGNOSIS — Z7902 Long term (current) use of antithrombotics/antiplatelets: Secondary | ICD-10-CM | POA: Diagnosis not present

## 2023-07-14 DIAGNOSIS — Z7982 Long term (current) use of aspirin: Secondary | ICD-10-CM | POA: Insufficient documentation

## 2023-07-14 MED ORDER — PAXLOVID (150/100) 10 X 150 MG & 10 X 100MG PO TBPK: 2.0000 | ORAL_TABLET | Freq: Two times a day (BID) | ORAL | 0 refills | Status: AC

## 2023-07-14 NOTE — ED Provider Notes (Signed)
[place] [facility] Provider Note   CSN: [id number] Arrival date & time: 07/14/23  8119     History  Chief Complaint  Patient presents with   Covid Positive    Ronald Tapia is a 67 y.o. male.  Patient here for more information about COVID.  Started having symptoms about 3 days ago.  Had a positive COVID test today.  COVID test was may be a little bit expired and he is wondering if he needed another COVID test.  Thinks he needs a work note.  Denies any shortness of breath chest pain weakness numbness tingling.  Mostly here for some congestion.  Pretty minimally symptomatic.  No fever.  History of cancer in remission, history of CAD.  The history is provided by the patient.       Home Medications Prior to Admission medications   Medication Sig Start Date End Date Taking? Authorizing Provider  nirmatrelvir/ritonavir, renal dosing, (PAXLOVID, 150/100,) 10 x 150 MG & 10 x 100MG  TBPK Take 2 tablets by mouth 2 (two) times daily for 5 days. Dosage for moderate renal impairment (eGFR >/= 30 to <60 mL/min): 150 mg nirmatrelvir (one 150 mg tablet) with 100 mg ritonavir (one 100 mg tablet), with both tablets taken together twice daily for 5 days. Not recommended if eGFR < 30 mL/min. PAXLOVID is not recommend in patients with severe hepatic impairment (Child-Pugh Class C). 07/14/23 07/19/23 Yes Elyce Zollinger, DO  acetaminophen (TYLENOL) 650 MG CR tablet Take 1,300 mg by mouth 2 (two) times daily as needed for pain.    [provider]  amLODipine (NORVASC) 2.5 MG tablet Take 1 tablet (2.5 mg total) by mouth daily. 05/20/23   Quintella Reichert, MD  ascorbic acid (VITAMIN C) 500 MG tablet Take 500 mg by mouth 2 (two) times daily.    [provider]  aspirin EC 81 MG tablet Take 81 mg by mouth daily. Swallow whole.    [provider]  Cholecalciferol (VITAMIN D3 PO) Take 7,500 Units by mouth every evening. 75000IU    [provider]  clopidogrel (PLAVIX) 75 MG tablet TAKE AS INSTRUCTED BY YOUR PRESCRIBER Patient taking differently: Take 75 mg by mouth daily. 05/09/23   Quintella Reichert, MD  Digestive Enzymes (DIGESTIVE ENZYME PO) Take 1 tablet by mouth 2 (two) times daily.    [provider]  ezetimibe (ZETIA) 10 MG tablet Take 1 tablet (10 mg total) by mouth daily. 05/21/23   Quintella Reichert, MD  fluticasone (FLONASE) 50 MCG/ACT nasal spray Place 1 spray into both nostrils 2 (two) times daily as needed for rhinitis (congestion).    [provider]  magnesium oxide (MAG-OX) 400 MG tablet Take 400 mg by mouth daily. 06/12/22   [provider]  methazolamide (NEPTAZANE) 25 MG tablet Take 25 mg by mouth 3 (three) times daily.    [provider]  nitroGLYCERIN (NITROSTAT) 0.4 MG SL tablet Place 1 tablet (0.4 mg total) under the tongue every 5 (five) minutes x 3 doses as needed for chest pain. 12/12/20   Quintella Reichert, MD  OVER THE COUNTER MEDICATION Take 1,000 mg by mouth 3 (three) times daily. Beet Root    [provider]  pantoprazole (PROTONIX) 40 MG tablet Take 1 tablet (40 mg total) by mouth daily at 6 PM. 06/27/23   Turner, Cornelious Bryant, MD  pregabalin (LYRICA) 50 MG capsule Take 50 mg by mouth 3 (three) times daily.  [provider]  rosuvastatin (CRESTOR) 5 MG tablet Take 1 tablet (5 mg total) by mouth daily. 05/21/23   Quintella Reichert, MD  temazepam (RESTORIL) 15 MG capsule Take 15 mg by mouth at bedtime as needed.    [provider]  tiZANidine (ZANAFLEX) 4 MG capsule Take 4 mg by mouth daily as needed for muscle spasms.    [provider]      Allergies    Sulfamethoxazole-trimethoprim, Imdur [isosorbide nitrate], Amitriptyline, Keflex [cephalexin], and Topiramate    Review of Systems   Review of Systems  Physical Exam Updated Vital Signs BP (!) 142/87   Pulse 99   Temp 98.5 F (36.9 C) (Oral)   Resp 18   Ht 5\' 9"  (1.753 m)   Wt 90  kg   SpO2 94%   BMI 29.30 kg/m  Physical Exam Vitals and nursing note reviewed.  Constitutional:      General: He is not in acute distress.    Appearance: He is well-developed.  HENT:     Head: Normocephalic and atraumatic.  Eyes:     Conjunctiva/sclera: Conjunctivae normal.  Cardiovascular:     Rate and Rhythm: Normal rate and regular rhythm.     Heart sounds: No murmur heard. Pulmonary:     Effort: Pulmonary effort is normal. No respiratory distress.     Breath sounds: Normal breath sounds.  Abdominal:     Palpations: Abdomen is soft.     Tenderness: There is no abdominal tenderness.  Musculoskeletal:        General: No swelling.     Cervical back: Neck supple.  Skin:    General: Skin is warm and dry.     Capillary Refill: Capillary refill takes less than 2 seconds.  Neurological:     Mental Status: He is alert.  Psychiatric:        Mood and Affect: Mood normal.     ED Results / Procedures / Treatments   Labs (all labs ordered are listed, but only abnormal results are displayed) Labs Reviewed - No data to display  EKG None  Radiology No results found.  Procedures Procedures    Medications Ordered in ED Medications - No data to display  ED Course/ Medical Decision Making/ A&P                                 Medical Decision Making Risk Prescription drug management.   Ronald Tapia is here with congestion.  Tested positive for COVID at home.  States the test was expired and thought he might need another test.  Overall he is minimally symptomatic.  I would suspect that positive test is more likely true than false.  Think it is reasonable to just assume that he has COVID and that this test was truly positive.  He is minimally symptomatic.  He got normal vitals.  He is got some comorbidities of CAD cancer history but overall we will leave it up to him if he feels antiviral prescription.  Patient appears well.  Given work note.  Educated about COVID.   Discharged in good condition.  Understands return precautions.  This chart was dictated using voice recognition software.  Despite best efforts to proofread,  errors can occur which can change the documentation meaning.         Final Clinical Impression(s) / ED Diagnoses Final diagnoses:  COVID-19    Rx / DC Orders  ED Discharge Orders          Ordered    nirmatrelvir/ritonavir, renal dosing, (PAXLOVID, 150/100,) 10 x 150 MG & 10 x 100MG  TBPK  2 times daily        07/14/23 0926              Virgina Norfolk, DO 07/14/23 [phone]

## 2023-07-14 NOTE — ED Triage Notes (Signed)
The patient stated he started feeling bad Friday. He tested positive for covid today at home. He stated he needs a work note.

## 2023-07-14 NOTE — Discharge Instructions (Signed)
I have sent antiviral medicine to your pharmacy if you want to get that filled.  You are minimally symptomatic and overall I will leave it up to you if you want to read more about the medicine to take it.  Otherwise I have written you out of work for few more days.  Please return if your symptoms worsen as discussed.

## 2023-07-16 ENCOUNTER — Ambulatory Visit (HOSPITAL_COMMUNITY): Payer: Managed Care, Other (non HMO)

## 2023-07-31 ENCOUNTER — Ambulatory Visit (HOSPITAL_COMMUNITY): Payer: Managed Care, Other (non HMO) | Attending: Internal Medicine

## 2023-07-31 DIAGNOSIS — E7800 Pure hypercholesterolemia, unspecified: Secondary | ICD-10-CM | POA: Diagnosis present

## 2023-07-31 DIAGNOSIS — I491 Atrial premature depolarization: Secondary | ICD-10-CM

## 2023-07-31 DIAGNOSIS — G4733 Obstructive sleep apnea (adult) (pediatric): Secondary | ICD-10-CM

## 2023-07-31 DIAGNOSIS — I25118 Atherosclerotic heart disease of native coronary artery with other forms of angina pectoris: Secondary | ICD-10-CM

## 2023-07-31 DIAGNOSIS — I10 Essential (primary) hypertension: Secondary | ICD-10-CM

## 2023-07-31 LAB — ECHOCARDIOGRAM COMPLETE
Area-P 1/2: 3.53 cm2
S' Lateral: 2.5 cm

## 2023-08-07 ENCOUNTER — Other Ambulatory Visit (HOSPITAL_BASED_OUTPATIENT_CLINIC_OR_DEPARTMENT_OTHER): Payer: Self-pay | Admitting: Cardiology

## 2023-08-19 ENCOUNTER — Other Ambulatory Visit (HOSPITAL_BASED_OUTPATIENT_CLINIC_OR_DEPARTMENT_OTHER): Payer: Self-pay | Admitting: Cardiology

## 2023-08-26 DIAGNOSIS — E7800 Pure hypercholesterolemia, unspecified: Secondary | ICD-10-CM

## 2023-10-11 ENCOUNTER — Telehealth: Payer: Self-pay | Admitting: Pharmacist

## 2023-10-11 ENCOUNTER — Ambulatory Visit: Payer: Managed Care, Other (non HMO) | Attending: Internal Medicine | Admitting: Student

## 2023-10-11 DIAGNOSIS — E7800 Pure hypercholesterolemia, unspecified: Secondary | ICD-10-CM | POA: Diagnosis not present

## 2023-10-11 NOTE — Telephone Encounter (Signed)
 PCSK9i PA request sent to the pool

## 2023-10-11 NOTE — Patient Instructions (Signed)
 Your Results:             Medication changes: start taking Crestor  5 mg twice a week and get updated lipid lab done on Monday Jan, 6, 2025. Once we will have updated lab and will start the process to get PCSK9i (Praluent or Repatha )  covered by your insurance.  Once the prior authorization is complete, we will call you to let you know and confirm pharmacy information.   Praluent is a cholesterol medication that improved your body's ability to get rid of bad cholesterol known as LDL. It can lower your LDL up to 60%. It is an injection that is given under the skin every 2 weeks. The most common side effects of Praluent include runny nose, symptoms of the common cold, rarely flu or flu-like symptoms, back/muscle pain in about 3-4% of the patients, and redness, pain, or bruising at the injection site.    Repatha  is a cholesterol medication that improved your body's ability to get rid of bad cholesterol known as LDL. It can lower your LDL up to 60%! It is an injection that is given under the skin every 2 weeks. The medication often requires a prior authorization from your insurance company.  The most common side effects of Repatha  include runny nose, symptoms of the common cold, rarely flu or flu-like symptoms, back/muscle pain in about 3-4% of the patients, and redness, pain, or bruising at the injection site.   Lab orders: We want to repeat labs after 2-3 months.  We will send you a lab order to remind you once we get closer to that time.

## 2023-10-11 NOTE — Assessment & Plan Note (Signed)
 Assessment:  LDL goal: < 70 mg/dl last LDLc 72 mg/dl (90/7975) while on Crestor  10 mg daily and Zetia  10 mg daily  Unable to tolerate Crestor , Zetia , and atorvastatin  daily dose - due to neuropathy  Discussed next potential options (PCSK-9 inhibitors, bempedoic acid and inclisiran); cost, dosing efficacy, side effects  Follows healthy diet and exercise regularly  Patient willing to try twice a week rosuvastatin  lowest dose   Plan: Start taking Crestor  5 mg twice a week and get updated lipid lab today  Will apply for PA for PCSK9i; will inform patient upon approval ( Lipid lab due in 2-3 months in therapy optimization

## 2023-10-11 NOTE — Progress Notes (Signed)
 Patient ID: Ronald Tapia                 DOB: 1956-03-01                    MRN: [id number]      HPI: Ronald Tapia is a 68 y.o. male patient referred to lipid clinic by Jackee Alberts, NP. PMH is significant for hypertension, CAD, GERD, HLD, Chest pain   Patient presented with his wife for lipid clinic. Reports statins and Zetia  worsen his neuropathy. He had tried few different statins including atorvastatin  and rosuvastatin . The last lipid lab done when he was on Crestor  10 mg along with Zetia  10 mg daily. He is off both of them since September 2024.  Reviewed options for lowering LDL cholesterol, including PCSK-9 inhibitors, bempedoic acid and inclisiran.  Discussed mechanisms of action, dosing, side effects and potential decreases in LDL cholesterol.  Also reviewed cost information and potential options for patient assistance.  Patient is willing to try twice a week Crestor  lowest dose to see if could tolerate it and will get updated baseline today.  Current Medications: none Intolerances: Zetia  10 mg , Crestor  5 mg , Atorvastatin  40 ng, 80 mg and 20 mg - worsen neuropathy  Risk Factors:hypertension, CAD,HLD, Chest pain s/p DES, family hx of premature ASCVD,Coronary calcium  score of 178  LDL goal: <70 mg/dl TG <849 mg/dl  Last Lab: LDLc 72, TC 123, TG 51, HDL 39 (06/2023)was on Crestor  10 mg and Zetia  10 mg daily   Diet: try to eat healthy but admit there is still room for improvement   Exercise: go to gym 3 times per week 15-20 min weight lifting - 15-20 min   Family History:  RelationProblemCommentsMother (Deceased)Father (Deceased) Diabetes Mellitus II   Heart attack died in his sleep, unclear if heart attack or stroke  Hypertension   Sister Breast cancer   Brother Kidney cancer   Maternal Grandfather Heart attack occurs in his 37s  Other - sister Metallurgist) Parkinson's disease    Social History:  Alcohol: none  Smoking: none  Labs: Lipid Panel     Component Value  Date/Time   CHOL 123 06/14/2023 0859   TRIG 51 06/14/2023 0859   HDL 39 (L) 06/14/2023 0859   CHOLHDL 3.2 06/14/2023 0859   CHOLHDL 5.9 04/24/2020 0532   VLDL 23 04/24/2020 0532   LDLCALC 72 06/14/2023 0859   LABVLDL 12 06/14/2023 0859    Past Medical History:  Diagnosis Date   Anxiety    Aortic atherosclerosis (HCC)    CAD (coronary artery disease)    a. 04/2020 Cor CTA: mid LAD dzs (FFR 0.53m, 0.61d); b. 04/2020 PCI: LM nl, LAD 90p/m, 54m (2.75x39mm Resolute Onyx DES), LCX nl, RCA nl.   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 04/2020 Echo: EF 65-70%, no rwma, triv MR/AI.   Hyperlipidemia 04/24/2020   Lymphoma (HCC)    White coat syndrome without hypertension     Current Outpatient Medications on File Prior to Visit  Medication Sig Dispense Refill   acetaminophen  (TYLENOL ) 650 MG CR tablet Take 1,300 mg by mouth 2 (two) times daily as needed for pain.     amLODipine  (NORVASC ) 2.5 MG tablet Take 1 tablet (2.5 mg total) by mouth daily. 90  tablet 3   ascorbic acid (VITAMIN C) 500 MG tablet Take 500 mg by mouth 2 (two) times daily.     aspirin  EC 81 MG tablet Take 81 mg by mouth daily. Swallow whole.     clopidogrel  (PLAVIX ) 75 MG tablet Take 1 tablet (75 mg total) by mouth daily. 90 tablet 3   Digestive Enzymes (DIGESTIVE ENZYME PO) Take 1 tablet by mouth 2 (two) times daily.     fluticasone (FLONASE) 50 MCG/ACT nasal spray Place 1 spray into both nostrils 2 (two) times daily as needed for rhinitis (congestion).     magnesium oxide (MAG-OX) 400 MG tablet Take 400 mg by mouth daily.     methazolamide (NEPTAZANE) 25 MG tablet Take 25 mg by mouth 3 (three) times daily.     nitroGLYCERIN  (NITROSTAT ) 0.4 MG SL tablet Place 1 tablet (0.4 mg total) under the tongue every 5 (five) minutes x 3 doses as needed for chest pain. 25 tablet 3    OVER THE COUNTER MEDICATION Take 1,000 mg by mouth 3 (three) times daily. Beet Root     pantoprazole  (PROTONIX ) 40 MG tablet Take 1 tablet (40 mg total) by mouth daily at 6 PM. 90 tablet 3   temazepam (RESTORIL) 15 MG capsule Take 15 mg by mouth at bedtime as needed.     tiZANidine (ZANAFLEX) 4 MG capsule Take 4 mg by mouth daily as needed for muscle spasms.     No current facility-administered medications on file prior to visit.    Allergies  Allergen Reactions   Sulfamethoxazole-Trimethoprim Itching and Hives   Imdur  [Isosorbide  Nitrate] Other (See Comments)    Severe headache lasting about 12 hours   Amitriptyline Other (See Comments)    Made me feel strange   Keflex [Cephalexin] Other (See Comments)    Stomach pain   Topiramate Other (See Comments)    Made me feel strange    Assessment/Plan:  1. Hyperlipidemia -  Problem  Hyperlipidemia   Current Medications: none Intolerances: Zetia  10 mg , Crestor  5 mg , Atorvastatin  40 ng, 80 mg and 20 mg - worsen neuropathy  Risk Factors:hypertension, CAD,HLD, Chest pain s/p DES, family hx of premature ASCVD  LDL goal: <70 mg/dl TG <849 mg/dl  Last Lab: LDLc 72, TC 123, TG 51, HDL 39 (06/2023)was on Crestor  10 mg and Zetia  10 mg daily    Hyperlipidemia Assessment:  LDL goal: < 70 mg/dl last LDLc 72 mg/dl (90/7975) while on Crestor  10 mg daily and Zetia  10 mg daily  Unable to tolerate Crestor , Zetia , and atorvastatin  daily dose - due to neuropathy  Discussed next potential options (PCSK-9 inhibitors, bempedoic acid and inclisiran); cost, dosing efficacy, side effects  Follows healthy diet and exercise regularly  Patient willing to try twice a week rosuvastatin  lowest dose   Plan: Start taking Crestor  5 mg twice a week and get updated lipid lab today  Will apply for PA for PCSK9i; will inform patient upon approval ( Lipid lab due in 2-3 months in therapy optimization     Thank you,  Robbi Blanch, Pharm.D [facility] [address], [place], [place] [address]  Phone: [phone]; Fax: [phone]

## 2023-10-12 LAB — LIPID PANEL
Chol/HDL Ratio: 5.2 {ratio} — ABNORMAL HIGH (ref 0.0–5.0)
Cholesterol, Total: 230 mg/dL — ABNORMAL HIGH (ref 100–199)
HDL: 44 mg/dL (ref >39)
LDL Chol Calc (NIH): 164 mg/dL — ABNORMAL HIGH (ref 0–99)
Triglycerides: 124 mg/dL (ref 0–149)
VLDL Cholesterol Cal: 22 mg/dL (ref 5–40)

## 2023-10-13 ENCOUNTER — Encounter: Payer: Self-pay | Admitting: Cardiology

## 2023-10-14 ENCOUNTER — Other Ambulatory Visit (HOSPITAL_COMMUNITY): Payer: Self-pay

## 2023-10-14 MED ORDER — REPATHA SURECLICK 140 MG/ML ~~LOC~~ SOAJ: 140.0000 mg | SUBCUTANEOUS | 3 refills | Status: DC

## 2023-10-14 NOTE — Addendum Note (Signed)
 Addended by: Tylene Fantasia on: 10/14/2023 01:30 PM   Modules accepted: Orders

## 2023-10-14 NOTE — Telephone Encounter (Signed)
 Pharmacy Patient Advocate Encounter   Received notification from Pt Calls Messages that prior authorization for Repatha  140mg /ml sureclick is required/requested.   Insurance verification completed.   The patient is insured through [employer] .   Per test claim: The current 28 day co-pay is, $35.00.  No PA needed at this time. This test claim was processed through [facility]- copay amounts may vary at other pharmacies due to pharmacy/plan contracts, or as the patient moves through the different stages of their insurance plan.     Pharmacy Patient Advocate Encounter   Received notification from Pt Calls Messages that prior authorization for Praluent 150mg /ml is required/requested.   Insurance verification completed.   The patient is insured through [employer] .   Per test claim: product not covered

## 2023-10-15 NOTE — Telephone Encounter (Signed)
 Contacted [employer]/customer service for assistance locating study.

## 2023-10-16 NOTE — Telephone Encounter (Signed)
 Per response from [employer] Support Team:  "The patient never used the device to take a sleep study. There has been no activity in activation or device pairing.  I recommend walking the patient through on how to use the device once more."

## 2023-10-24 ENCOUNTER — Ambulatory Visit: Payer: Managed Care, Other (non HMO) | Admitting: Cardiology

## 2023-10-31 ENCOUNTER — Telehealth: Payer: Self-pay

## 2023-10-31 DIAGNOSIS — G4733 Obstructive sleep apnea (adult) (pediatric): Secondary | ICD-10-CM

## 2023-10-31 NOTE — Telephone Encounter (Signed)
In lab sleep study ordered

## 2023-11-19 NOTE — Telephone Encounter (Signed)
Ref code#[id number]--AUTH# [id number]--VALID DATES-11/15/2023---02/13/2024--[id number] NPSG]

## 2024-01-08 ENCOUNTER — Ambulatory Visit (HOSPITAL_BASED_OUTPATIENT_CLINIC_OR_DEPARTMENT_OTHER): Payer: Managed Care, Other (non HMO) | Attending: Cardiology | Admitting: Cardiology

## 2024-01-08 DIAGNOSIS — G4733 Obstructive sleep apnea (adult) (pediatric): Secondary | ICD-10-CM | POA: Diagnosis present

## 2024-01-08 DIAGNOSIS — G4736 Sleep related hypoventilation in conditions classified elsewhere: Secondary | ICD-10-CM | POA: Diagnosis not present

## 2024-01-10 ENCOUNTER — Telehealth: Payer: Self-pay | Admitting: Pharmacist

## 2024-01-10 DIAGNOSIS — E7800 Pure hypercholesterolemia, unspecified: Secondary | ICD-10-CM

## 2024-01-10 NOTE — Procedures (Signed)
   [facility] [address] [place], [place] [address] Tel: [phone]   Fax: [phone]  Polysomnography Interpretation  Patient Name:  Ronald Tapia, Ronald Tapia Date:  01/08/2024 Referring Physician:  Armanda Magic, Md  Indications for Polysomnography The patient is a 68 year-old Male who is 5\' 9"  and weighs 190.0 lbs. His BMI equals 28.1.  A full night polysomnogram was performed to evaluate for Obstructive Sleep Apnea.  Medication taken at 9:45 pm  Restoril   Polysomnogram Data A full night polysomnogram recorded the standard physiologic parameters including EEG, EOG, EMG, EKG, nasal and oral airflow.  Respiratory parameters of chest and abdominal movements were recorded with Respiratory Inductance Plethysmography belts.  Oxygen saturation was recorded by pulse oximetry.   Sleep Architecture The total recording time of the polysomnogram was 378.9 minutes.  The total sleep time was 285.0 minutes.  The patient spent 5.3% of total sleep time in Stage N1, 73.0% in Stage N2, 0.7% in Stages N3, and 21.1% in REM.  Sleep latency was 9.7 minutes.  REM latency was 47.0 minutes.  Sleep Efficiency was 75.2%.  Wake after Sleep Onset time was 84.0 minutes.  Respiratory Events The polysomnogram revealed a presence of 2 obstructive, 1 central, and 0 mixed apneas resulting in an Apnea index of 0.6 events per hour.  There were 97 hypopneas (>=3% desaturation and/or arousal) resulting in an Apnea\Hypopnea Index (AHI >=3% desaturation and/or arousal) of 21.1 events per hour.  There were 71 hypopneas (>=4% desaturation) resulting in an Apnea\Hypopnea Index (AHI >=4% desaturation) of 15.6 events per hour.  There were 4 Respiratory Effort Related Arousals resulting in a RERA index of 0.8 events per hour. The Respiratory Disturbance Index is 21.9 events per hour.  The snore index was 422.5 events per hour.  Mean oxygen saturation was 91.5%.  The lowest oxygen saturation  during sleep was 77.0%.  Time spent <=88% oxygen saturation was 20.4 minutes (5.6%).  Limb Activity There were 0 total limb movements recorded.  Cardiac Summary The average pulse rate was 66.6 bpm.  The minimum pulse rate was 50.0 bpm while the maximum pulse rate was 91.0 bpm.  Cardiac rhythm was Normal.  Diagnosis:  Moderate Obstructive Sleep Apnea Nocturnal Hypoxemia  Recommendations: Recommend in lab CPAP titration for sleep disordered breathing.\ The patient should be counseled in good sleep hygiene and avoid sleeping supine. The patient should be counseled to avoid driving when sleepy. Consider ENT consult to assess for surgical causes of sleep apnea. Other treatments such as oral dental appliance or Hypoglossal nerve simulator can be considered if intolerant to CPAP therapy.   This study was personally reviewed and electronically signed by: Armanda Magic, Md Accredited Board Certified in Sleep Medicine Date/Time: 01/10/2024 12:56PM

## 2024-01-10 NOTE — Telephone Encounter (Signed)
 Call to remind follow up lipid lab. Started Repatha early Jan 2025. Confirm pt still takes Crestor 5 mg 3 times per week and Repatha 140 mg Q14D. Will be going for lab on 01/14/24.

## 2024-01-14 ENCOUNTER — Telehealth: Payer: Self-pay

## 2024-01-14 DIAGNOSIS — I491 Atrial premature depolarization: Secondary | ICD-10-CM

## 2024-01-14 DIAGNOSIS — I25118 Atherosclerotic heart disease of native coronary artery with other forms of angina pectoris: Secondary | ICD-10-CM

## 2024-01-14 DIAGNOSIS — G4733 Obstructive sleep apnea (adult) (pediatric): Secondary | ICD-10-CM

## 2024-01-14 DIAGNOSIS — R079 Chest pain, unspecified: Secondary | ICD-10-CM

## 2024-01-14 DIAGNOSIS — I10 Essential (primary) hypertension: Secondary | ICD-10-CM

## 2024-01-14 DIAGNOSIS — E7800 Pure hypercholesterolemia, unspecified: Secondary | ICD-10-CM

## 2024-01-14 NOTE — Telephone Encounter (Signed)
-----   Message from Armanda Magic sent at 01/10/2024 12:56 PM EDT ----- Please let patient know that they have sleep apnea.  Recommend therapeutic CPAP titration for treatment of patient's sleep disordered breathing.

## 2024-01-14 NOTE — Telephone Encounter (Signed)
Notified patient of sleep study results and recommendations. All questions (if any) were answered. Patient verbalized understanding.

## 2024-01-15 LAB — LIPID PANEL
Chol/HDL Ratio: 2 ratio (ref 0.0–5.0)
Cholesterol, Total: 86 mg/dL — ABNORMAL LOW (ref 100–199)
HDL: 44 mg/dL (ref >39)
LDL Chol Calc (NIH): 29 mg/dL (ref 0–99)
Triglycerides: 51 mg/dL (ref 0–149)
VLDL Cholesterol Cal: 13 mg/dL (ref 5–40)

## 2024-01-16 ENCOUNTER — Encounter: Payer: Self-pay | Admitting: Pharmacist

## 2024-04-16 ENCOUNTER — Encounter (HOSPITAL_BASED_OUTPATIENT_CLINIC_OR_DEPARTMENT_OTHER): Payer: Self-pay | Admitting: Emergency Medicine

## 2024-04-16 ENCOUNTER — Emergency Department (HOSPITAL_BASED_OUTPATIENT_CLINIC_OR_DEPARTMENT_OTHER)

## 2024-04-16 ENCOUNTER — Other Ambulatory Visit: Payer: Self-pay

## 2024-04-16 ENCOUNTER — Emergency Department (HOSPITAL_BASED_OUTPATIENT_CLINIC_OR_DEPARTMENT_OTHER)
Admission: EM | Admit: 2024-04-16 | Discharge: 2024-04-16 | Disposition: A | Discharge status: Home / Self Care | Attending: Emergency Medicine | Admitting: Emergency Medicine

## 2024-04-16 DIAGNOSIS — I10 Essential (primary) hypertension: Secondary | ICD-10-CM | POA: Diagnosis not present

## 2024-04-16 DIAGNOSIS — I2510 Atherosclerotic heart disease of native coronary artery without angina pectoris: Secondary | ICD-10-CM | POA: Insufficient documentation

## 2024-04-16 DIAGNOSIS — Z7902 Long term (current) use of antithrombotics/antiplatelets: Secondary | ICD-10-CM | POA: Insufficient documentation

## 2024-04-16 DIAGNOSIS — Z79899 Other long term (current) drug therapy: Secondary | ICD-10-CM | POA: Insufficient documentation

## 2024-04-16 DIAGNOSIS — Z7982 Long term (current) use of aspirin: Secondary | ICD-10-CM | POA: Diagnosis not present

## 2024-04-16 DIAGNOSIS — Z955 Presence of coronary angioplasty implant and graft: Secondary | ICD-10-CM | POA: Insufficient documentation

## 2024-04-16 DIAGNOSIS — R079 Chest pain, unspecified: Secondary | ICD-10-CM

## 2024-04-16 DIAGNOSIS — R110 Nausea: Secondary | ICD-10-CM | POA: Diagnosis not present

## 2024-04-16 DIAGNOSIS — R0789 Other chest pain: Secondary | ICD-10-CM | POA: Diagnosis present

## 2024-04-16 LAB — BASIC METABOLIC PANEL WITH GFR
Anion gap: 12 (ref 5–15)
BUN: 16 mg/dL (ref 8–23)
CO2: 23 mmol/L (ref 22–32)
Calcium: 9.4 mg/dL (ref 8.9–10.3)
Chloride: 105 mmol/L (ref 98–111)
Creatinine, Ser: 0.86 mg/dL (ref 0.61–1.24)
GFR, Estimated: >60 mL/min (ref >60)
Glucose, Bld: 98 mg/dL (ref 70–99)
Potassium: 3.9 mmol/L (ref 3.5–5.1)
Sodium: 140 mmol/L (ref 135–145)

## 2024-04-16 LAB — CBC
HCT: 43.7 % (ref 39.0–52.0)
Hemoglobin: 15 g/dL (ref 13.0–17.0)
MCH: 31.2 pg (ref 26.0–34.0)
MCHC: 34.3 g/dL (ref 30.0–36.0)
MCV: 90.9 fL (ref 80.0–100.0)
Platelets: 142 K/uL — ABNORMAL LOW (ref 150–400)
RBC: 4.81 MIL/uL (ref 4.22–5.81)
RDW: 12.3 % (ref 11.5–15.5)
WBC: 5.1 K/uL (ref 4.0–10.5)
nRBC: 0 % (ref 0.0–0.2)

## 2024-04-16 LAB — TROPONIN T, HIGH SENSITIVITY: Troponin T High Sensitivity: <15 ng/L (ref <19)

## 2024-04-16 NOTE — ED Provider Triage Note (Signed)
 Emergency Medicine Provider Triage Evaluation Note  Alfred Harrel , a 68 y.o. male  was evaluated in triage.  Pt complains of chest pain for 4-5 days   Review of Systems  Positive: Chest pain Negative: cough  Physical Exam  BP (!) 142/84 (BP Location: Right Arm)   Pulse 62   Temp 97.8 F (36.6 C) (Oral)   Resp 13   Ht 5' 9 (1.753 m)   Wt 86.2 kg   SpO2 97%   BMI 28.06 kg/m  Gen:   Awake, no distress   Resp:  Normal effort  MSK:   Moves extremities without difficulty  Other:  NABS  Medical Decision Making  Medically screening exam initiated at 6:54 AM.  Appropriate orders placed.  Kevaughn Ewing was informed that the remainder of the evaluation will be completed by another provider, this initial triage assessment does not replace that evaluation, and the importance of remaining in the ED until their evaluation is complete.     Citlalic Norlander, MD 04/16/24 9345

## 2024-04-16 NOTE — ED Triage Notes (Signed)
 Chest pain and nausea X few days.

## 2024-04-16 NOTE — Discharge Instructions (Addendum)
 All the blood work, EKG and x-ray today look good but it is a good idea for you to follow-up with cardiology just to make sure everything with your stents is good.  Continue your current medications.  If you start feeling extremely short of breath, any feeling of passing out or repetitive vomiting return to the emergency room.

## 2024-04-16 NOTE — ED Provider Notes (Signed)
 [place] [facility] Provider Note   CSN: [id number] Arrival date & time: 04/16/24  9384     Patient presents with: Chest Pain   Ronald Tapia is a 68 y.o. male.   Pt is a 68 y/o male with hx of hypertension, CAD with DES to the LAD, GERD, HLD, Chest pain who is presenting today with complaint of left-sided chest pain and nausea.  Patient reports that the chest pain initially started on Friday when he was walking up a hill and became very short of breath and developed the pain.  He reports it did go away initially but then came back on Sunday and he has had it ever since.  It also makes him feel nauseated but he has not had more shortness of breath like he had on Friday.  He denies vomiting, cough, fever, abdominal pain, lower extremity swelling.  He cannot really think of anything that makes the pain worse.  However this morning around 2 AM he felt like the pain was just worse today than it had been the last 2 days.  He reports other than the very first day when the pain started exertion has not affected the pain.  Lying down or sitting up does not affect the pain.  He is not sure if eating affects that but says maybe it makes a little better.  He has not tried taking any medication for the pain.  He does report a history of chest pain in the past 1 which required him to get a stent in 2021 but more recently about a year ago and he reported he had an echo and some blood test and everything seemed normal.  He denies any injury.  No pleuritic component.   Chest Pain      Prior to Admission medications   Medication Sig Start Date End Date Taking? Authorizing Provider  acetaminophen  (TYLENOL ) 650 MG CR tablet Take 1,300 mg by mouth 2 (two) times daily as needed for pain.    [provider]  amLODipine  (NORVASC ) 2.5 MG tablet Take 1 tablet (2.5 mg total) by mouth daily. 08/19/23   Shlomo Wilbert SAUNDERS, MD  ascorbic acid (VITAMIN C) 500 MG tablet Take  500 mg by mouth 2 (two) times daily.    [provider]  aspirin  EC 81 MG tablet Take 81 mg by mouth daily. Swallow whole.    [provider]  clopidogrel  (PLAVIX ) 75 MG tablet Take 1 tablet (75 mg total) by mouth daily. 08/07/23   Shlomo Wilbert SAUNDERS, MD  Digestive Enzymes (DIGESTIVE ENZYME PO) Take 1 tablet by mouth 2 (two) times daily.    [provider]  Evolocumab  (REPATHA  SURECLICK) 140 MG/ML SOAJ Inject 140 mg into the skin every 14 (fourteen) days. 10/14/23   Shlomo Wilbert SAUNDERS, MD  fluticasone (FLONASE) 50 MCG/ACT nasal spray Place 1 spray into both nostrils 2 (two) times daily as needed for rhinitis (congestion).    [provider]  magnesium oxide (MAG-OX) 400 MG tablet Take 400 mg by mouth daily. 06/12/22   [provider]  methazolamide (NEPTAZANE) 25 MG tablet Take 25 mg by mouth 3 (three) times daily.    [provider]  nitroGLYCERIN  (NITROSTAT ) 0.4 MG SL tablet Place 1 tablet (0.4 mg total) under the tongue every 5 (five) minutes x 3 doses as needed for chest pain. 12/12/20   Shlomo Wilbert SAUNDERS, MD  OVER THE COUNTER MEDICATION Take 1,000 mg by mouth 3 (three) times  daily. Beet Root    [provider]  pantoprazole  (PROTONIX ) 40 MG tablet Take 1 tablet (40 mg total) by mouth daily at 6 PM. 06/27/23   Turner, Wilbert SAUNDERS, MD  temazepam (RESTORIL) 15 MG capsule Take 15 mg by mouth at bedtime as needed.    [provider]  tiZANidine (ZANAFLEX) 4 MG capsule Take 4 mg by mouth daily as needed for muscle spasms.    [provider]    Allergies: Sulfamethoxazole-trimethoprim, Imdur  [isosorbide  nitrate], Amitriptyline, Keflex [cephalexin], and Topiramate    Review of Systems  Cardiovascular:  Positive for chest pain.    Updated Vital Signs BP (!) 142/84 (BP Location: Right Arm)   Pulse 62   Temp 97.8 F (36.6 C) (Oral)   Resp 13   Ht 5' 9 (1.753 m)   Wt 86.2 kg   SpO2 97%   BMI 28.06 kg/m   Physical Exam Vitals  and nursing note reviewed.  Constitutional:      General: He is not in acute distress.    Appearance: He is well-developed.  HENT:     Head: Normocephalic and atraumatic.  Eyes:     Conjunctiva/sclera: Conjunctivae normal.     Pupils: Pupils are equal, round, and reactive to light.  Cardiovascular:     Rate and Rhythm: Normal rate and regular rhythm.     Pulses: Normal pulses.     Heart sounds: No murmur heard. Pulmonary:     Effort: Pulmonary effort is normal. No respiratory distress.     Breath sounds: Normal breath sounds. No wheezing or rales.     Comments: Slight tenderness in the left upper chest Chest:     Chest wall: Tenderness present.  Abdominal:     General: There is no distension.     Palpations: Abdomen is soft.     Tenderness: There is no abdominal tenderness. There is no guarding or rebound.  Musculoskeletal:        General: No tenderness. Normal range of motion.     Cervical back: Normal range of motion and neck supple.     Right lower leg: No edema.     Left lower leg: No edema.  Skin:    General: Skin is warm and dry.     Findings: No erythema or rash.  Neurological:     Mental Status: He is alert and oriented to person, place, and time. Mental status is at baseline.  Psychiatric:        Behavior: Behavior normal.     (all labs ordered are listed, but only abnormal results are displayed) Labs Reviewed  CBC - Abnormal; Notable for the following components:      Result Value   Platelets 142 (*)    All other components within normal limits  BASIC METABOLIC PANEL WITH GFR  TROPONIN T, HIGH SENSITIVITY    EKG: EKG Interpretation Date/Time:  Thursday April 16 2024 06:24:36 EDT Ventricular Rate:  63 PR Interval:  181 QRS Duration:  96 QT Interval:  393 QTC Calculation: 403 R Axis:   60  Text Interpretation: Sinus rhythm Normal ECG Confirmed by Doretha Folks ([id number]) on 04/16/2024 7:04:21 AM  Radiology: [facility] Chest 2 View Result Date:  04/16/2024 CLINICAL DATA:  Chest pain nausea. EXAM: CHEST - 2 VIEW COMPARISON:  PA Lat chest 04/24/2022 FINDINGS: The heart size and mediastinal contours are within normal limits. There are minimal calcifications in the transverse aorta. There is an LAD coronary artery stent again noted. Both lungs  are clear. The visualized skeletal structures are intact. 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 This has been seen on prior studies dating back to a myelogram from 01/18/2022, and was not there on studies before this date, but whether this is myelographic contrast or not is undetermined. IMPRESSION: No evidence of acute chest disease or interval changes. Electronically Signed   By: Francis Quam M.D.   On: 04/16/2024 07:07     Procedures   Medications Ordered in the ED - No data to display                                  Medical Decision Making Amount and/or Complexity of Data Reviewed External Data Reviewed: notes. Labs: ordered. Decision-making details documented in ED Course. Radiology: ordered and independent interpretation performed. Decision-making details documented in ED Course. ECG/medicine tests: ordered and independent interpretation performed. Decision-making details documented in ED Course.   Pt with multiple medical problems and comorbidities and presenting today with a complaint that caries a high risk for morbidity and mortality.  Here today with complaint of chest pain.  Patient does have multiple risk factors including hypertension, hyperlipidemia, prior stents in the LAD.  However patient also has GERD.  Possibility for ACS, GERD, musculoskeletal nature.  Low suspicion for PE or infectious etiology.  Low suspicion for pericarditis or myocarditis.  No findings to suggest dissection.  Patient has no abdominal pain to suggest pancreatitis, cholecystitis or  hepatitis.  I independently interpreted patient's labs and EKG.  EKG without acute changes and no evidence of ST elevation today, CBC, BMP and troponin are all reassuring and normal.  However when looking through patient's prior charts when he initially was having chest pain and underwent catheterization and had 90% stenosis of his LAD he had a normal EKG and troponin at that time.  However since that time he has had chest pain with normal workup.  Will discuss case with cardiology for moving forward Spoke with Dr. Lavona on the phone.  Discussed patient's case and feel that patient needs close outpatient cardiology follow-up which he will help arrange.  At this time patient does not need immediate catheterization but will most likely need more outpatient testing.  This seems very reasonable and discussed this with the patient and his wife.  They are comfortable with this plan.  He was given return precautions.     Final diagnoses:  Nonspecific chest pain    ED Discharge Orders     None          Doretha Folks, MD 04/16/24 0800

## 2024-04-21 ENCOUNTER — Encounter: Payer: Self-pay | Admitting: Cardiology

## 2024-04-21 ENCOUNTER — Ambulatory Visit: Attending: Cardiology | Admitting: Cardiology

## 2024-04-21 VITALS — BP 160/82 | HR 70 | Ht 69.0 in | Wt 194.0 lb

## 2024-04-21 DIAGNOSIS — I10 Essential (primary) hypertension: Secondary | ICD-10-CM | POA: Diagnosis not present

## 2024-04-21 DIAGNOSIS — I25118 Atherosclerotic heart disease of native coronary artery with other forms of angina pectoris: Secondary | ICD-10-CM

## 2024-04-21 DIAGNOSIS — R079 Chest pain, unspecified: Secondary | ICD-10-CM

## 2024-04-21 DIAGNOSIS — E7800 Pure hypercholesterolemia, unspecified: Secondary | ICD-10-CM

## 2024-04-21 NOTE — Addendum Note (Signed)
 Addended by: JANIT GENI CROME on: 04/21/2024 01:51 PM   Modules accepted: Orders

## 2024-04-21 NOTE — Addendum Note (Signed)
 Addended by: JANIT GENI CROME on: 04/21/2024 04:08 PM   Modules accepted: Orders

## 2024-04-21 NOTE — Progress Notes (Signed)
 Date:  04/21/2024   ID:  Elna Morrow, DOB 04-Jun-1956, MRN [id number] The patient was identified using 2 identifiers.   PCP:  Walke, Devyn Leeann, PA-C  Cardiologist:  Wilbert Bihari, MD  Electrophysiologist:  None   Evaluation Performed:  Follow-Up Visit  Chief Complaint:  CAD, HTN, HLD  History of Present Illness:    Magic Mohler is a 68 y.o. male with with history of CAD (s/p PCI of the LAD on 04/25/2020), lymphoma, 2-year history of debilitating headaches ultimately found to be due to CSF leak s/p repair at [facility], GERD, borderline hypertension, hyperlipidemia, & aortic atherosclerosis.   He was admitted for chest pain in July 2021. Echo showed normal LV function without any significant valvular abnormalities. Coronary CT angiography showed possible significant stenosis in the mid LAD with an FFR of 0.72 in the mid vessel and 0.61 in the distal vessel and aortic atherosclerosis. Cath showed severe 90% proximal to mid LAD stenosis and 75% mid LAD stenosis, coronaries otherwise normal. He underwent successful PCI and stenting of the LAD. He was readmitted 7/23-7/24/21 with recurrent chest pain without significant change with NTG. There was an area just above and lateral to his left breast which was slightly tender. His workup was benign with normal sed rate, normal EKG, and completely normal enzymes. His P2Y12 was 71 indicating adequate Plavix  response. He was continued on home gabapentin  and started on Imdur /Voltaren  empirically.   He was seen back in clinic by Raphael Bring, PA in August and was doing well. He was walking regularly and was up to 12 minutes twice daily. He would still notice occasional chest discomfort on that same side, worse upon waking up in the morning, specifically on the side he sleeps on and actually would feel better when he walked. He stopped Imdur  due to headaches and lack of clinical benefit.   His atorvastatin  was reduced due to possible SE of parasthesias,  sluggishness and cold intolerance.  Amlodipine  low dose was added for better BP control.    He was back in the hospital in Dec 2021 in the ER with atypical CP reproducible with chest wall palpation and felt to be MSK and resolved.    He was doing well until last week when he developed chest pain and was seen in the ER>  Left sided CP with nausea and started while walking up a hill and got SOB and then 2 days later started having CP.  He says that several hours prior to getting the CP he had thrown some heavy bags of trash over a trash dump.  In the ER hsTrop was normal, Cxray was normal and EKG was normal with no ST changes.  The pain lasted about 7 days constant and has improved on its own after he started taking Tylenol  and using hot and cold compressions. He is doing well today.  He still has some mild chest discomort that is a pressure that has never gone away since 7/10.  There is no radiation of the pressure with no further nasuea or diaphoresis.  He denies any SOB, DOE, LE edema, dizziness, palpitations or syncope.    Past Medical History:  Diagnosis Date   Anxiety    Aortic atherosclerosis (HCC)    CAD (coronary artery disease)    a. 04/2020 Cor CTA: mid LAD dzs (FFR 0.64m, 0.61d); b. 04/2020 PCI: LM nl, LAD 90p/m, 71m (2.75x75mm Resolute Onyx DES), LCX nl, RCA nl.   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 04/2020 Echo: EF 65-70%, no rwma, triv MR/AI.   Hyperlipidemia 04/24/2020   Lymphoma (HCC)    White coat syndrome without hypertension    Past Surgical History:  Procedure Laterality Date   BACK SURGERY     CORONARY STENT INTERVENTION N/A 04/25/2020   Procedure: CORONARY STENT INTERVENTION;  Surgeon: Court Dorn PARAS, MD;  Location: [facility] INVASIVE CV LAB;  Service: Cardiovascular;  Laterality: N/A;   LEFT HEART CATH AND CORONARY  ANGIOGRAPHY N/A 04/25/2020   Procedure: LEFT HEART CATH AND CORONARY ANGIOGRAPHY;  Surgeon: Court Dorn PARAS, MD;  Location: [facility] INVASIVE CV LAB;  Service: Cardiovascular;  Laterality: N/A;     Current Meds  Medication Sig   acetaminophen  (TYLENOL ) 650 MG CR tablet Take 1,300 mg by mouth 2 (two) times daily as needed for pain.   amLODipine  (NORVASC ) 2.5 MG tablet Take 1 tablet (2.5 mg total) by mouth daily.   ascorbic acid (VITAMIN C) 500 MG tablet Take 500 mg by mouth 2 (two) times daily.   aspirin  EC 81 MG tablet Take 81 mg by mouth daily. Swallow whole.   clopidogrel  (PLAVIX ) 75 MG tablet Take 1 tablet (75 mg total) by mouth daily.   Coenzyme Q10 (COQ10) 100 MG CAPS Take 100 mg by mouth daily.   Digestive Enzymes (DIGESTIVE ENZYME PO) Take 1 tablet by mouth 2 (two) times daily.   Evolocumab  (REPATHA  SURECLICK) 140 MG/ML SOAJ Inject 140 mg into the skin every 14 (fourteen) days.   fluticasone (FLONASE) 50 MCG/ACT nasal spray Place 1 spray into both nostrils 2 (two) times daily as needed for rhinitis (congestion).   magnesium oxide (MAG-OX) 400 MG tablet Take 400 mg by mouth daily.   nitroGLYCERIN  (NITROSTAT ) 0.4 MG SL tablet Place 1 tablet (0.4 mg total) under the tongue every 5 (five) minutes x 3 doses as needed for chest pain.   OVER THE COUNTER MEDICATION Take 1,000 mg by mouth 3 (three) times daily. Beet Root   pantoprazole  (PROTONIX ) 40 MG tablet Take 1 tablet (40 mg total) by mouth daily at 6 PM.   temazepam (RESTORIL) 15 MG capsule Take 15 mg by mouth at bedtime as needed.   tiZANidine (ZANAFLEX) 4 MG capsule Take 4 mg by mouth daily as needed for muscle spasms.   Zinc 50 MG TABS Take 1 tablet by mouth daily.     Allergies:   Sulfamethoxazole-trimethoprim, Imdur  [isosorbide  nitrate], Amitriptyline, Keflex [cephalexin], and Topiramate   Social History   Tobacco Use   Smoking status: Never   Smokeless tobacco: Never  Vaping Use   Vaping status: Never Used  Substance Use Topics    Alcohol use: Not Currently   Drug use: Never     Family Hx: The patient's family history includes Breast cancer in his sister; Diabetes Mellitus II in his father; Heart attack in his father and maternal grandfather; Hypertension in his father; Kidney cancer in his brother; Parkinson's disease in an other family member.  ROS:   Please see the history of present illness.    none All other systems reviewed and are negative.   Prior CV studies:   The following studies were reviewed today:   Labs/Other Tests and Data Reviewed:     Recent Labs: 06/14/2023: ALT 20 04/16/2024: BUN 16; Creatinine, Ser 0.86; Hemoglobin 15.0; Platelets 142; Potassium  3.9; Sodium 140   Recent Lipid Panel Lab Results  Component Value Date/Time   CHOL 86 (L) 01/14/2024 09:11 AM   TRIG 51 01/14/2024 09:11 AM   HDL 44 01/14/2024 09:11 AM   CHOLHDL 2.0 01/14/2024 09:11 AM   CHOLHDL 5.9 04/24/2020 05:32 AM   LDLCALC 29 01/14/2024 09:11 AM    Wt Readings from Last 3 Encounters:  04/21/24 194 lb (88 kg)  04/16/24 190 lb (86.2 kg)  01/08/24 190 lb (86.2 kg)      Objective:    Vital Signs:  BP (!) 160/82   Pulse 70   Ht 5' 9 (1.753 m)   Wt 194 lb (88 kg)   SpO2 97%   BMI 28.65 kg/m   GEN: Well nourished, well developed in no acute distress HEENT: Normal NECK: No JVD; No carotid bruits LYMPHATICS: No lymphadenopathy CARDIAC:RRR, no murmurs, rubs, gallops RESPIRATORY:  Clear to auscultation without rales, wheezing or rhonchi  ABDOMEN: Soft, non-tender, non-distended MUSCULOSKELETAL:  No edema; No deformity  SKIN: Warm and dry NEUROLOGIC:  Alert and oriented x 3 PSYCHIATRIC:  Normal affect  ASSESSMENT & PLAN:    1.  ASCAD -Coronary CT angiography showed possible significant stenosis in the mid LAD with an FFR of 0.72 in the mid vessel and 0.61 in the distal vessel and aortic atherosclerosis.  -Cath showed severe 90% proximal to mid LAD stenosis and 75% mid LAD stenosis, coronaries otherwise  normal.  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 -continued to have chronic CP with negative workup after PCI and felt to possibly be related to possible GERD/MSK -he had an episode of vague CP in Feb 22 that occurred after he was in a hot shower early in the am and felt dizzy and had pre syncope and then had severe abdominal pain and then some CP that felt like his typical GERD pain.   -He recently was in the ER with CP similar to what he has had several times after the stent and was felt not to be cardiac>>No in the ER with more intense CP with negative workup -I will get a Stress PET CT to assess for ischemia -Informed Consent   Shared Decision Making/Informed Consent The risks [chest pain, shortness of breath, cardiac arrhythmias, dizziness, blood pressure fluctuations, myocardial infarction, stroke/transient ischemic attack, nausea, vomiting, allergic reaction, radiation exposure, metallic taste sensation and life-threatening complications (estimated to be 1 in 10,000)], benefits (risk stratification, diagnosing coronary artery disease, treatment guidance) and alternatives of a cardiac PET stress test were discussed in detail with Mr. Brine and he agrees to proceed. -Continue aspirin  81 mg daily, Plavix  75 mg daily, Repatha  with as needed refills  2.  HTN -BP elevated on exam today>>he takes his BP meds in the evening -Continue amlodipine  2.5 mg daily with as needed refills -I have asked him to check his BP twice daily for a week and call with results  3.  HLD -LDL goal < 70 -He is statin intolerant -I have personally reviewed and interpreted outside labs performed by patient's PCP which showed LDL 29, HDL 44 on 01/14/2024 and ALT 20 on 06/14/2023 -Continue Repatha  with as needed refills  Tests Ordered: No orders of the defined types were placed in this encounter.   Medication Changes: No orders of the defined types were placed in this encounter.   Follow Up:  In  Person in 1 year  Signed, Wilbert Bihari, MD  04/21/2024 1:33 PM    [facility]

## 2024-04-21 NOTE — Addendum Note (Signed)
 Addended by: SHLOMO CORNING R on: 04/21/2024 04:50 PM   Modules accepted: Orders

## 2024-04-21 NOTE — Patient Instructions (Signed)
 Medication Instructions:  Your physician recommends that you continue on your current medications as directed. Please refer to the Current Medication list given to you today.  *If you need a refill on your cardiac medications before your next appointment, please call your pharmacy*  Lab Work: Please complete a troponin lab draw at the lab on the first floor before you leave.  If you have labs (blood work) drawn today and your tests are completely normal, you will receive your results only by: MyChart Message (if you have MyChart) OR A paper copy in the mail If you have any lab test that is abnormal or we need to change your treatment, we will call you to review the results.  Testing/Procedures:    Please report to Radiology at the [facility] Main Entrance 30 minutes early for your test.  [address] [place], [place] [address]                  How to Prepare for Your Cardiac PET/CT Stress Test:  Nothing to eat or drink, except water, 3 hours prior to arrival time.  NO caffeine/decaffeinated products, or chocolate 12 hours prior to arrival. (Please note decaffeinated beverages (teas/coffees) still contain caffeine).  If you have caffeine within 12 hours prior, the test will need to be rescheduled.  Medication instructions: Do not take erectile dysfunction medications for 72 hours prior to test (sildenafil, tadalafil) Do not take nitrates (isosorbide  mononitrate, Ranexa) the day before or day of test Do not take tamsulosin the day before or morning of test Hold theophylline containing medications for 12 hours. Hold Dipyridamole 48 hours prior to the test.   You may take your remaining medications with water.  NO perfume, cologne or lotion on chest or abdomen area. FEMALES - Please avoid wearing dresses to this appointment.  Total time is 1 to 2 hours; you may want to bring reading material for the waiting time.  IF YOU THINK YOU MAY BE PREGNANT, OR ARE NURSING  PLEASE INFORM THE TECHNOLOGIST.  In preparation for your appointment, medication and supplies will be purchased.  Appointment availability is limited, so if you need to cancel or reschedule, please call the Radiology Department Scheduler at [phone] 24 hours in advance to avoid a cancellation fee of $100.00  What to Expect When you Arrive:  Once you arrive and check in for your appointment, you will be taken to a preparation room within the Radiology Department.  A technologist or Nurse will obtain your medical history, verify that you are correctly prepped for the exam, and explain the procedure.  Afterwards, an IV will be started in your arm and electrodes will be placed on your skin for EKG monitoring during the stress portion of the exam. Then you will be escorted to the PET/CT scanner.  There, staff will get you positioned on the scanner and obtain a blood pressure and EKG.  During the exam, you will continue to be connected to the EKG and blood pressure machines.  A small, safe amount of a radioactive tracer will be injected in your IV to obtain a series of pictures of your heart along with an injection of a stress agent.    After your Exam:  It is recommended that you eat a meal and drink a caffeinated beverage to counter act any effects of the stress agent.  Drink plenty of fluids for the remainder of the day and urinate frequently for the first couple of hours after the  exam.  Your doctor will inform you of your test results within 7-10 business days.  For more information and frequently asked questions, please visit our website: [email or web address]  For questions about your test or how to prepare for your test, please call: Cardiac Imaging Nurse Navigators Office: [phone]   Follow-Up: At [facility], you and your health needs are our priority.  As part of our continuing mission to provide you with exceptional heart care, our providers are all part of  one team.  This team includes your primary Cardiologist (physician) and Advanced Practice Providers or APPs (Physician Assistants and Nurse Practitioners) who all work together to provide you with the care you need, when you need it.  Your next appointment:   1 year(s)  Provider:   Wilbert Bihari, MD

## 2024-04-22 ENCOUNTER — Ambulatory Visit: Payer: Self-pay | Admitting: Cardiology

## 2024-04-22 LAB — TROPONIN T: Troponin T (Highly Sensitive): 7 ng/L (ref 0–22)

## 2024-04-23 ENCOUNTER — Telehealth: Payer: Self-pay

## 2024-04-23 DIAGNOSIS — G4733 Obstructive sleep apnea (adult) (pediatric): Secondary | ICD-10-CM

## 2024-04-23 DIAGNOSIS — I25118 Atherosclerotic heart disease of native coronary artery with other forms of angina pectoris: Secondary | ICD-10-CM

## 2024-04-23 NOTE — Telephone Encounter (Signed)
**Note De-Identified Amayia Ciano Obfuscation** I called [employer] and started a CPAP Titration PA over the phone with Annabella HERO.  Per Annabella, I have faxed all clinicals (24 pages) to them at [phone] and I did receive confirmation that the fax sent successfully. The PA is currently pending.  Case #: [id number]

## 2024-04-28 NOTE — Telephone Encounter (Signed)
**Note De-Identified Yunuen Mordan Obfuscation** Letter received from [facility] stating that they have denied this CPAP Titration PA because treatment with APAP must have been tried for at least 30 days. The notes that we received from your doctor do not show this trail. Reference code: [id number]  Forwarding this note to Dr Shlomo for advisement.

## 2024-04-30 NOTE — Telephone Encounter (Signed)
 Per Dr Shlomo ,Order ResMed CPAP on auto from 4 to 15cm H2O with heated humidity, mask of choice. Order placed to [facility] as the dme.  Patient notified  Upon patient request DME selection is [facility] Patient understands he will be contacted by [facility] to set up his cpap. Patient understands to call if [facility] does not contact him with new setup in a timely manner. Patient understands they will be called once confirmation has been received from [facility] that they have received their new machine to schedule 10 week follow up appointment.   [facility] notified of new cpap order  Please add to airview Patient was grateful for the call and thanked me.

## 2024-05-19 ENCOUNTER — Encounter (HOSPITAL_BASED_OUTPATIENT_CLINIC_OR_DEPARTMENT_OTHER): Payer: Self-pay | Admitting: Emergency Medicine

## 2024-05-19 ENCOUNTER — Other Ambulatory Visit: Payer: Self-pay

## 2024-05-19 ENCOUNTER — Emergency Department (HOSPITAL_BASED_OUTPATIENT_CLINIC_OR_DEPARTMENT_OTHER)

## 2024-05-19 ENCOUNTER — Emergency Department (HOSPITAL_BASED_OUTPATIENT_CLINIC_OR_DEPARTMENT_OTHER): Admission: EM | Admit: 2024-05-19 | Discharge: 2024-05-19 | Disposition: A | Discharge status: Home / Self Care

## 2024-05-19 ENCOUNTER — Encounter: Payer: Self-pay | Admitting: Cardiology

## 2024-05-19 DIAGNOSIS — Z7982 Long term (current) use of aspirin: Secondary | ICD-10-CM | POA: Diagnosis not present

## 2024-05-19 DIAGNOSIS — R079 Chest pain, unspecified: Secondary | ICD-10-CM | POA: Diagnosis present

## 2024-05-19 DIAGNOSIS — R0789 Other chest pain: Secondary | ICD-10-CM | POA: Diagnosis not present

## 2024-05-19 DIAGNOSIS — R5381 Other malaise: Secondary | ICD-10-CM | POA: Diagnosis not present

## 2024-05-19 DIAGNOSIS — Z7902 Long term (current) use of antithrombotics/antiplatelets: Secondary | ICD-10-CM | POA: Diagnosis not present

## 2024-05-19 LAB — CBC
HCT: 41.8 % (ref 39.0–52.0)
Hemoglobin: 14.5 g/dL (ref 13.0–17.0)
MCH: 31.9 pg (ref 26.0–34.0)
MCHC: 34.7 g/dL (ref 30.0–36.0)
MCV: 91.9 fL (ref 80.0–100.0)
Platelets: 155 K/uL (ref 150–400)
RBC: 4.55 MIL/uL (ref 4.22–5.81)
RDW: 12.4 % (ref 11.5–15.5)
WBC: 5.8 K/uL (ref 4.0–10.5)
nRBC: 0 % (ref 0.0–0.2)

## 2024-05-19 LAB — BASIC METABOLIC PANEL WITH GFR
Anion gap: 11 (ref 5–15)
BUN: 12 mg/dL (ref 8–23)
CO2: 23 mmol/L (ref 22–32)
Calcium: 9.4 mg/dL (ref 8.9–10.3)
Chloride: 105 mmol/L (ref 98–111)
Creatinine, Ser: 0.81 mg/dL (ref 0.61–1.24)
GFR, Estimated: >60 mL/min (ref >60)
Glucose, Bld: 105 mg/dL — ABNORMAL HIGH (ref 70–99)
Potassium: 3.7 mmol/L (ref 3.5–5.1)
Sodium: 139 mmol/L (ref 135–145)

## 2024-05-19 LAB — TROPONIN T, HIGH SENSITIVITY
Troponin T High Sensitivity: <15 ng/L (ref <19)
Troponin T High Sensitivity: <15 ng/L (ref <19)

## 2024-05-19 MED ORDER — ONDANSETRON 4 MG PO TBDP
4.0000 mg | ORAL_TABLET | Freq: Once | ORAL | Status: AC
  Administered 2024-05-19 (×2): 4 mg via ORAL
  Filled 2024-05-19: qty 1

## 2024-05-19 MED ORDER — ONDANSETRON 4 MG PO TBDP: 4.0000 mg | ORAL_TABLET | Freq: Three times a day (TID) | ORAL | 0 refills | Status: AC | PRN

## 2024-05-19 MED ORDER — ALUM & MAG HYDROXIDE-SIMETH 200-200-20 MG/5ML PO SUSP
30.0000 mL | Freq: Once | ORAL | Status: AC
  Administered 2024-05-19 (×2): 30 mL via ORAL
  Filled 2024-05-19: qty 30

## 2024-05-19 NOTE — ED Provider Notes (Signed)
 [place] [facility] AT [facility] Provider Note   CSN: [id number] Arrival date & time: 05/19/24  9360     Patient presents with: Hypertension and Chest Pain   Ronald Tapia is a 68 y.o. male.   This is a 68 year old male presenting emergency department for concern for his blood pressure as well as some intermittent chest pain over the past several weeks.  Reports generalized malaise since Sunday with queasy stomach, no nausea vomiting or diarrhea.  Has had some intermittent sensation of palpations and fast heart rate.  Has checked his blood pressure and noted to be in the 170s systolic.  Chest pain feels similar to GERD that has had in the past, but has not responded to his GERD medications.  Low risk for PE with Wells criteria.  Recently saw his cardiologist and is scheduled for stress test   Hypertension Associated symptoms include chest pain.  Chest Pain      Prior to Admission medications   Medication Sig Start Date End Date Taking? Authorizing Provider  ondansetron  (ZOFRAN -ODT) 4 MG disintegrating tablet Take 1 tablet (4 mg total) by mouth every 8 (eight) hours as needed for nausea or vomiting. 05/19/24  Yes Neysa Caron PARAS, DO  acetaminophen  (TYLENOL ) 650 MG CR tablet Take 1,300 mg by mouth 2 (two) times daily as needed for pain.    [provider]  amLODipine  (NORVASC ) 2.5 MG tablet Take 1 tablet (2.5 mg total) by mouth daily. 08/19/23   Shlomo Wilbert SAUNDERS, MD  ascorbic acid (VITAMIN C) 500 MG tablet Take 500 mg by mouth 2 (two) times daily.    [provider]  aspirin  EC 81 MG tablet Take 81 mg by mouth daily. Swallow whole.    [provider]  clopidogrel  (PLAVIX ) 75 MG tablet Take 1 tablet (75 mg total) by mouth daily. 08/07/23   Shlomo Wilbert SAUNDERS, MD  Coenzyme Q10 (COQ10) 100 MG CAPS Take 100 mg by mouth daily. 11/09/23   [provider]  Digestive Enzymes (DIGESTIVE ENZYME PO) Take 1 tablet by mouth 2 (two) times  daily.    [provider]  Evolocumab  (REPATHA  SURECLICK) 140 MG/ML SOAJ Inject 140 mg into the skin every 14 (fourteen) days. 10/14/23   Shlomo Wilbert SAUNDERS, MD  fluticasone (FLONASE) 50 MCG/ACT nasal spray Place 1 spray into both nostrils 2 (two) times daily as needed for rhinitis (congestion).    [provider]  magnesium oxide (MAG-OX) 400 MG tablet Take 400 mg by mouth daily. 06/12/22   [provider]  nitroGLYCERIN  (NITROSTAT ) 0.4 MG SL tablet Place 1 tablet (0.4 mg total) under the tongue every 5 (five) minutes x 3 doses as needed for chest pain. 12/12/20   Shlomo Wilbert SAUNDERS, MD  OVER THE COUNTER MEDICATION Take 1,000 mg by mouth 3 (three) times daily. Beet Root    [provider]  pantoprazole  (PROTONIX ) 40 MG tablet Take 1 tablet (40 mg total) by mouth daily at 6 PM. 06/27/23   Turner, Wilbert SAUNDERS, MD  temazepam (RESTORIL) 15 MG capsule Take 15 mg by mouth at bedtime as needed.    [provider]  tiZANidine (ZANAFLEX) 4 MG capsule Take 4 mg by mouth daily as needed for muscle spasms.    [provider]  Zinc 50 MG TABS Take 1 tablet by mouth daily. 10/08/20   [provider]    Allergies: Sulfamethoxazole-trimethoprim, Imdur  [isosorbide  nitrate], Amitriptyline, Keflex [cephalexin], and Topiramate    Review of Systems  Cardiovascular:  Positive for chest pain.    Updated Vital Signs BP 130/80 (BP Location: Left Arm)   Pulse 64   Temp 98.1 F (36.7 C) (Oral)   Resp 16   Ht 5' 9 (1.753 m)   Wt 86.2 kg   SpO2 97%   BMI 28.06 kg/m   Physical Exam Vitals and nursing note reviewed.  Constitutional:      General: He is not in acute distress. HENT:     Head: Normocephalic.  Cardiovascular:     Rate and Rhythm: Normal rate and regular rhythm.     Pulses:          Radial pulses are 2+ on the right side and 2+ on the left side.     Heart sounds: Normal heart sounds.  Pulmonary:     Effort: Pulmonary effort is normal.     Breath  sounds: Normal breath sounds.  Abdominal:     Palpations: Abdomen is soft.     Tenderness: There is no abdominal tenderness.  Musculoskeletal:     Right lower leg: No edema.     Left lower leg: No edema.  Skin:    General: Skin is warm and dry.     Capillary Refill: Capillary refill takes less than 2 seconds.  Neurological:     Mental Status: He is alert and oriented to person, place, and time.  Psychiatric:        Mood and Affect: Mood normal.        Behavior: Behavior normal.     (all labs ordered are listed, but only abnormal results are displayed) Labs Reviewed  BASIC METABOLIC PANEL WITH GFR - Abnormal; Notable for the following components:      Result Value   Glucose, Bld 105 (*)    All other components within normal limits  CBC  TROPONIN T, HIGH SENSITIVITY  TROPONIN T, HIGH SENSITIVITY    EKG: EKG Interpretation Date/Time:  Tuesday May 19 2024 06:49:16 EDT Ventricular Rate:  59 PR Interval:  182 QRS Duration:  92 QT Interval:  400 QTC Calculation: 397 R Axis:   59  Text Interpretation: Sinus rhythm Confirmed by Neysa Clap [phone]) on 05/19/2024 6:56:11 AM  Radiology: [facility] Chest 2 View Result Date: 05/19/2024 CLINICAL DATA:  Chest pain, shortness of breath. EXAM: CHEST - 2 VIEW COMPARISON:  April 16, 2024. FINDINGS: The heart size and mediastinal contours are within normal limits. Both lungs are clear. The visualized skeletal structures are unremarkable. IMPRESSION: No active cardiopulmonary disease. Electronically Signed   By: Lynwood Landy Raddle M.D.   On: 05/19/2024 08:45     Procedures   Medications Ordered in the ED  alum & mag hydroxide-simeth (MAALOX/MYLANTA) 200-200-20 MG/5ML suspension 30 mL (30 mLs Oral Given 05/19/24 0729)  ondansetron  (ZOFRAN -ODT) disintegrating tablet 4 mg (4 mg Oral Given 05/19/24 0730)    Clinical Course as of 05/19/24 1352  Tue May 19, 2024  0701 Per my chart review Saw cardiology 04/21/24:   1.  [facility] -Coronary CT  angiography showed possible significant stenosis in the mid LAD with an FFR of 0.72 in the mid vessel and 0.61 in the distal vessel and aortic atherosclerosis.  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 -continued to have chronic CP with negative workup after PCI and felt to possibly be related to possible GERD/MSK -he had an episode of vague  CP in Feb 22 that occurred after he was in a hot shower early in the am and felt dizzy and had pre syncope and then had severe abdominal pain and then some CP that felt like his typical GERD pain.   -He recently was in the ER with CP similar to what he has had several times after the stent and was felt not to be cardiac>>No in the ER with more intense CP with negative workup -I will get a Stress PET CT to assess for ischemia  [TY]  0730 Troponin T High Sensitivity: <15 [TY]  0730 Basic metabolic panel(!) No significant metabolic derangements.  No kidney injury [TY]  0730 CBC No leukocytosis to suggest infectious process.  No anemia [TY]  0937 Troponin T High Sensitivity: <15 Negative x 2.  ACS unlikely [TY]  [phone] Patient with some mild improvement with GI cocktail and Zofran .  Continues to have stable vitals.  Will discharge in stable condition [TY]    Clinical Course User Index [TY] Neysa Caron PARAS, DO                                 Medical Decision Making This is a 68 year old male presenting the emergency department with multiple complaints.  Was concerned about his blood pressure at home, however 137/83 here.  Complaining of palpations/fast heart rates intermittently.  Appears to be normal sinus rhythm and rate in the low 60s on the monitor as interpreted by me.  His EKG similar with sinus rhythm.  No ST segment changes to indicate ischemia.  Does have cardiac history as noted in ED course for my chart review from recent  cardiology appointment.  However his initial troponin was negative.  He is clinically well-appearing.  Low suspicion for PE and is low risk for PE based on Wells criteria.  He does note that his chest pain feels similar to GERD that he has had in the past.  Will give GI cocktail and Zofran .  Plan for repeat troponin and reevaluation after medications.  See ED course for further MDM and disposition  Amount and/or Complexity of Data Reviewed Independent Historian: spouse    Details: Notes stress test in a few weeks External Data Reviewed:     Details: See ED course Labs: ordered. Decision-making details documented in ED Course. Radiology: ordered and independent interpretation performed.    Details: Chest x-ray without pneumonia or pneumothorax on my independent interpretation ECG/medicine tests: independent interpretation performed.    Details: See above  Risk OTC drugs. Prescription drug management. Decision regarding hospitalization.       Final diagnoses:  Atypical chest pain    ED Discharge Orders          Ordered    ondansetron  (ZOFRAN -ODT) 4 MG disintegrating tablet  Every 8 hours PRN        05/19/24 0939               Neysa Caron PARAS, DO 05/19/24 1352

## 2024-05-19 NOTE — ED Notes (Signed)
 Patient transported to X-ray

## 2024-05-19 NOTE — Discharge Instructions (Signed)
 Please follow-up with your cardiologist as soon as possible.  Return if you develop fevers, chills, severe headache, facial droop, unilateral weakness, passout, worsening chest pain, shortness of breath, inability to eat or drink due to nausea vomiting, severe abdominal pain or any new or worsening symptoms that are concerning to you.

## 2024-05-19 NOTE — ED Triage Notes (Signed)
 Pt states high HR and BP since Sunday. Developed chest pain yesterday. Followed by cardiology.

## 2024-05-26 ENCOUNTER — Encounter (HOSPITAL_COMMUNITY): Payer: Self-pay

## 2024-05-28 ENCOUNTER — Ambulatory Visit
Admission: RE | Admit: 2024-05-28 | Discharge: 2024-05-28 | Disposition: A | Discharge status: Home / Self Care | Source: Ambulatory Visit | Attending: Cardiology | Admitting: Cardiology

## 2024-05-28 DIAGNOSIS — R079 Chest pain, unspecified: Secondary | ICD-10-CM | POA: Diagnosis present

## 2024-05-28 MED ORDER — REGADENOSON 0.4 MG/5ML IV SOLN
0.4000 mg | Freq: Once | INTRAVENOUS | Status: AC
  Administered 2024-05-28: 0.4 mg via INTRAVENOUS
  Filled 2024-05-28: qty 5

## 2024-05-28 MED ORDER — REGADENOSON 0.4 MG/5ML IV SOLN
INTRAVENOUS | Status: AC
  Filled 2024-05-28: qty 5

## 2024-05-28 MED ORDER — RUBIDIUM RB82 GENERATOR (RUBYFILL)
25.0000 | PACK | Freq: Once | INTRAVENOUS | Status: AC
  Administered 2024-05-28: 21.69 via INTRAVENOUS

## 2024-05-28 MED ORDER — RUBIDIUM RB82 GENERATOR (RUBYFILL)
25.0000 | PACK | Freq: Once | INTRAVENOUS | Status: AC
  Administered 2024-05-28: 22.08 via INTRAVENOUS

## 2024-05-28 NOTE — Progress Notes (Signed)
 Patient presents for a cardiac PET stress test and tolerated procedure. He did report some chest tightness that lasted for about a minute. Patient maintained acceptable vital signs throughout the test and was offered caffeine after test.  Patient ambulated out of department with a steady gait.

## 2024-05-29 LAB — NM PET CT CARDIAC PERFUSION MULTI W/ABSOLUTE BLOODFLOW
LV dias vol: 90 mL (ref 62–150)
LV sys vol: 26 mL (ref 4.2–5.8)
MBFR: 3.58
Nuc Rest EF: 54 %
Nuc Stress EF: 69 %
Peak HR: 89 {beats}/min
Rest HR: 55 {beats}/min
Rest MBF: 0.59 ml/g/min
Rest Nuclear Isotope Dose: 21.7 mCi
SRS: 0
SSS: 0
ST Depression (mm): 0 mm
Stress MBF: 2.11 ml/g/min
Stress Nuclear Isotope Dose: 22.1 mCi
TID: 1.13

## 2024-06-22 ENCOUNTER — Other Ambulatory Visit: Payer: Self-pay | Admitting: Cardiology

## 2024-06-23 ENCOUNTER — Other Ambulatory Visit (HOSPITAL_COMMUNITY)

## 2024-06-25 ENCOUNTER — Encounter: Payer: Self-pay | Admitting: *Deleted

## 2024-06-25 NOTE — Progress Notes (Signed)
 Ronald Tapia order canceled as patient has had an alternative sleep study. Removed patient registration from [facility] RN, BSN, CCRN-A

## 2024-07-27 ENCOUNTER — Telehealth: Payer: Self-pay | Admitting: *Deleted

## 2024-07-27 NOTE — Telephone Encounter (Signed)
 Per Dr Shlomo, Your Cpap compliance Report has good events and compliance. Continue current settings.

## 2024-08-03 ENCOUNTER — Other Ambulatory Visit (HOSPITAL_BASED_OUTPATIENT_CLINIC_OR_DEPARTMENT_OTHER): Payer: Self-pay | Admitting: Cardiology

## 2024-08-12 ENCOUNTER — Other Ambulatory Visit (HOSPITAL_BASED_OUTPATIENT_CLINIC_OR_DEPARTMENT_OTHER): Payer: Self-pay | Admitting: Cardiology

## 2024-09-09 ENCOUNTER — Encounter: Payer: Self-pay | Admitting: Cardiology

## 2024-09-10 MED ORDER — REPATHA SURECLICK 140 MG/ML ~~LOC~~ SOAJ: 140.0000 mg | SUBCUTANEOUS | 3 refills | Status: AC
